# Patient Record
Sex: Male | Born: 1950 | Race: Black or African American | Hispanic: No | Marital: Single
Health system: Southern US, Community
[De-identification: ages and names within clinical notes are randomized; demographics above are authoritative.]

## PROBLEM LIST (undated history)

## (undated) DIAGNOSIS — R569 Unspecified convulsions: Secondary | ICD-10-CM

## (undated) HISTORY — DX: Unspecified convulsions: R56.9

---

## 2021-01-02 ENCOUNTER — Emergency Department (HOSPITAL_COMMUNITY)
Admission: EM | Admit: 2021-01-02 | Discharge: 2021-01-02 | Disposition: A | Discharge status: Home / Self Care | Payer: Medicare Other | Attending: Emergency Medicine | Admitting: Emergency Medicine

## 2021-01-02 ENCOUNTER — Emergency Department (HOSPITAL_COMMUNITY): Payer: Medicare Other

## 2021-01-02 ENCOUNTER — Other Ambulatory Visit: Payer: Self-pay

## 2021-01-02 DIAGNOSIS — R4182 Altered mental status, unspecified: Secondary | ICD-10-CM | POA: Diagnosis present

## 2021-01-02 DIAGNOSIS — R892 Abnormal level of other drugs, medicaments and biological substances in specimens from other organs, systems and tissues: Secondary | ICD-10-CM | POA: Diagnosis not present

## 2021-01-02 DIAGNOSIS — R269 Unspecified abnormalities of gait and mobility: Secondary | ICD-10-CM

## 2021-01-02 DIAGNOSIS — R2681 Unsteadiness on feet: Secondary | ICD-10-CM | POA: Diagnosis not present

## 2021-01-02 DIAGNOSIS — R404 Transient alteration of awareness: Secondary | ICD-10-CM | POA: Diagnosis not present

## 2021-01-02 DIAGNOSIS — R251 Tremor, unspecified: Secondary | ICD-10-CM | POA: Insufficient documentation

## 2021-01-02 DIAGNOSIS — R7889 Finding of other specified substances, not normally found in blood: Secondary | ICD-10-CM

## 2021-01-02 LAB — COMPREHENSIVE METABOLIC PANEL
ALT: 24 U/L (ref 0–44)
AST: 25 U/L (ref 15–41)
Albumin: 3.7 g/dL (ref 3.5–5.0)
Alkaline Phosphatase: 129 U/L — ABNORMAL HIGH (ref 38–126)
Anion gap: 7 (ref 5–15)
BUN: 14 mg/dL (ref 8–23)
CO2: 29 mmol/L (ref 22–32)
Calcium: 8.9 mg/dL (ref 8.9–10.3)
Chloride: 100 mmol/L (ref 98–111)
Creatinine, Ser: 1.11 mg/dL (ref 0.61–1.24)
GFR, Estimated: >60 mL/min (ref >60)
Glucose, Bld: 107 mg/dL — ABNORMAL HIGH (ref 70–99)
Potassium: 4.1 mmol/L (ref 3.5–5.1)
Sodium: 136 mmol/L (ref 135–145)
Total Bilirubin: 0.7 mg/dL (ref 0.3–1.2)
Total Protein: 7.1 g/dL (ref 6.5–8.1)

## 2021-01-02 LAB — LACTIC ACID, PLASMA: Lactic Acid, Venous: 1.3 mmol/L (ref 0.5–1.9)

## 2021-01-02 LAB — CBC WITH DIFFERENTIAL/PLATELET
Abs Immature Granulocytes: 0.04 10*3/uL (ref 0.00–0.07)
Basophils Absolute: 0 10*3/uL (ref 0.0–0.1)
Basophils Relative: 1 %
Eosinophils Absolute: 0.2 10*3/uL (ref 0.0–0.5)
Eosinophils Relative: 3 %
HCT: 44.6 % (ref 39.0–52.0)
Hemoglobin: 14.8 g/dL (ref 13.0–17.0)
Immature Granulocytes: 1 %
Lymphocytes Relative: 30 %
Lymphs Abs: 1.7 10*3/uL (ref 0.7–4.0)
MCH: 28 pg (ref 26.0–34.0)
MCHC: 33.2 g/dL (ref 30.0–36.0)
MCV: 84.5 fL (ref 80.0–100.0)
Monocytes Absolute: 0.7 10*3/uL (ref 0.1–1.0)
Monocytes Relative: 13 %
Neutro Abs: 2.9 10*3/uL (ref 1.7–7.7)
Neutrophils Relative %: 52 %
Platelets: 232 10*3/uL (ref 150–400)
RBC: 5.28 MIL/uL (ref 4.22–5.81)
RDW: 13.3 % (ref 11.5–15.5)
WBC: 5.6 10*3/uL (ref 4.0–10.5)
nRBC: 0 % (ref 0.0–0.2)

## 2021-01-02 LAB — URINALYSIS, MICROSCOPIC (REFLEX): Squamous Epithelial / HPF: NONE SEEN (ref 0–5)

## 2021-01-02 LAB — PHENOBARBITAL LEVEL: Phenobarbital: 41 ug/mL — ABNORMAL HIGH (ref 15.0–30.0)

## 2021-01-02 LAB — AMMONIA: Ammonia: 20 umol/L (ref 9–35)

## 2021-01-02 LAB — URINALYSIS, ROUTINE W REFLEX MICROSCOPIC
Bilirubin Urine: NEGATIVE
Glucose, UA: NEGATIVE mg/dL
Ketones, ur: NEGATIVE mg/dL
Leukocytes,Ua: NEGATIVE
Nitrite: NEGATIVE
Protein, ur: NEGATIVE mg/dL
Specific Gravity, Urine: 1.015 (ref 1.005–1.030)
pH: 7 (ref 5.0–8.0)

## 2021-01-02 LAB — PHENYTOIN LEVEL, TOTAL: Phenytoin Lvl: 36.3 ug/mL (ref 10.0–20.0)

## 2021-01-02 LAB — TROPONIN I (HIGH SENSITIVITY)
Troponin I (High Sensitivity): 14 ng/L (ref <18)
Troponin I (High Sensitivity): 13 ng/L (ref <18)

## 2021-01-02 MED ORDER — PHENYTOIN SODIUM EXTENDED 100 MG PO CAPS: 300.0000 mg | ORAL_CAPSULE | Freq: Every day | ORAL | 0 refills | Status: AC

## 2021-01-02 MED ORDER — SODIUM CHLORIDE 0.9 % IV BOLUS
1000.0000 mL | Freq: Once | INTRAVENOUS | Status: AC
  Administered 2021-01-02: 06:00:00 1000 mL via INTRAVENOUS

## 2021-01-02 NOTE — Discharge Instructions (Signed)
You were seen today for altered mental status and gait disturbance.  This may be related to elevated phenytoin levels.  Do not take phenytoin on Tuesday or Wednesday.  Restart phenytoin on Thursday.  Take 300 mg nightly starting on Thursday.  Also do not take phenobarbital today.  You may restart your normal dosage tomorrow.  Follow-up with your primary physician for repeat levels 1 week ago.

## 2021-01-02 NOTE — ED Triage Notes (Signed)
Pt brought to ED by sister with c/o AMS, confusion and abnormal gait x1 week. Has hx of epilepsy and MR. Sister also endorses pt reports of painful urination.

## 2021-01-02 NOTE — ED Provider Notes (Signed)
Emergency Medicine Provider Triage Evaluation Note  Lee Mckenzie , a 70 y.o. male  was evaluated in triage.  Pt complains of AMS.  States has been off balance lately, sliding out of bed, nearly falling in bathroom, etc.  Denies known head injury or LOC.  States he did complain of some dysuria today.  Has hx of seizures and developmental delays.  Takes dilantin, phenobarbital, and lorazepam for seizures-- levels not checked recently.  Review of Systems  Positive: AMS, dysuria Negative: fever  Physical Exam  BP 133/84 (BP Location: Right Arm)    Pulse 75    Temp 97.8 F (36.6 C) (Oral)    Resp 18    SpO2 100%   Gen:   Awake, no distress, no visible head trauma Resp:  Normal effort  MSK:   Moves extremities without difficulty  Other:  Able to answer some questions, following commands when prompted, gait not tested in triage  Medical Decision Making  Medically screening exam initiated at 2:14 AM.  Appropriate orders placed.  Lee Mckenzie was informed that the remainder of the evaluation will be completed by another provider, this initial triage assessment does not replace that evaluation, and the importance of remaining in the ED until their evaluation is complete.  AMS.  On multiple seizure medications, has been compliant per family at bedside.  No falls or head trauma but has been sliding out of bed, etc.  VSS.  Will check labs, med levels, UA w/culture, CT head, CXR.   Garlon Hatchet, PA-C 01/02/21 [id number]    Shon Baton, MD 01/02/21 [phone]

## 2021-01-02 NOTE — ED Provider Notes (Signed)
[facility] EMERGENCY DEPARTMENT Provider Note   CSN: [id number] Arrival date & time: 01/02/21  0030     History Chief Complaint  Patient presents with   Altered Mental Status    Lee Mckenzie is a 70 y.o. male.  HPI     This is a 70 year old male with a history of epilepsy who presents with altered mental status.  Sister provides most of the history.  He lives primarily with his sister.  She reports that he at baseline has a developmental delay.  Over the last week to 2 weeks, he has had increasing altered mental status.  He states that he is not directable and will try multiple times to get out of bed after being told not to.  She states that he has fallen to the floor several times but has not hit his head or lost consciousness.  She reports that he appears unsteady on his feet.  He has a history of epilepsy but has not had a seizure in many years.  He has not had any recent changes in medications.  Denies any recent illnesses including fever, cough, congestion, shortness of breath.  Patient denies any chest pain.  Does report some dysuria and urinary frequency.  No past medical history on file.  There are no problems to display for this patient.  PMH: EPilepsy  No family history on file.     Home Medications Prior to Admission medications   Medication Sig Start Date End Date Taking? Authorizing Provider  phenytoin (DILANTIN) 100 MG ER capsule Take 3 capsules (300 mg total) by mouth at bedtime. 01/02/21  Yes Maximino Cozzolino, Mayer Masker, MD    Allergies    Patient has no known allergies.  Review of Systems   Review of Systems  Constitutional:  Negative for fever.  Respiratory:  Negative for shortness of breath.   Cardiovascular:  Negative for chest pain.  Gastrointestinal:  Negative for abdominal pain, nausea and vomiting.  Genitourinary:  Positive for dysuria and frequency.  Musculoskeletal:  Positive for gait problem.  Neurological:  Positive  for weakness. Negative for seizures.  All other systems reviewed and are negative.  Physical Exam Updated Vital Signs BP (!) 177/82    Pulse 98    Temp 97.8 F (36.6 C) (Oral)    Resp 15    Ht 1.956 m (6\' 5" )    Wt 88 kg    SpO2 100%    BMI 23.01 kg/m   Physical Exam Vitals and nursing note reviewed.  Constitutional:      Appearance: He is well-developed.     Comments: Chronically ill-appearing but nontoxic  HENT:     Head: Normocephalic and atraumatic.     Nose: Nose normal.     Mouth/Throat:     Mouth: Mucous membranes are moist.  Eyes:     Pupils: Pupils are equal, round, and reactive to light.  Cardiovascular:     Rate and Rhythm: Normal rate and regular rhythm.     Heart sounds: Normal heart sounds. No murmur heard. Pulmonary:     Effort: Pulmonary effort is normal. No respiratory distress.     Breath sounds: Normal breath sounds. No wheezing.  Abdominal:     General: Bowel sounds are normal.     Palpations: Abdomen is soft.     Tenderness: There is no abdominal tenderness. There is no rebound.  Musculoskeletal:     Cervical back: Neck supple.  Lymphadenopathy:     Cervical: No  cervical adenopathy.  Skin:    General: Skin is warm and dry.  Neurological:     Mental Status: He is alert and oriented to person, place, and time.     Comments: Oriented to self and place but not time (baseline), 5 out of 5 strength in all 4 extremities, slight resting tremor noted, no dysmetria to finger-nose-finger, follows commands, unsteady but not ataxic gait noted  Psychiatric:        Mood and Affect: Mood normal.    ED Results / Procedures / Treatments   Labs (all labs ordered are listed, but only abnormal results are displayed) Labs Reviewed  PHENYTOIN LEVEL, TOTAL - Abnormal; Notable for the following components:      Result Value   Phenytoin Lvl 36.3 (*)    All other components within normal limits  COMPREHENSIVE METABOLIC PANEL - Abnormal; Notable for the following  components:   Glucose, Bld 107 (*)    Alkaline Phosphatase 129 (*)    All other components within normal limits  URINALYSIS, ROUTINE W REFLEX MICROSCOPIC - Abnormal; Notable for the following components:   Hgb urine dipstick TRACE (*)    All other components within normal limits  PHENOBARBITAL LEVEL - Abnormal; Notable for the following components:   Phenobarbital 41.0 (*)    All other components within normal limits  URINALYSIS, MICROSCOPIC (REFLEX) - Abnormal; Notable for the following components:   Bacteria, UA RARE (*)    All other components within normal limits  URINE CULTURE  CBC WITH DIFFERENTIAL/PLATELET  AMMONIA  LACTIC ACID, PLASMA  TROPONIN I (HIGH SENSITIVITY)  TROPONIN I (HIGH SENSITIVITY)    EKG EKG Interpretation  Date/Time:  Tuesday January 02 2021 02:18:08 EST Ventricular Rate:  75 PR Interval:  176 QRS Duration: 92 QT Interval:  368 QTC Calculation: 410 R Axis:   90 Text Interpretation: Normal sinus rhythm Rightward axis Left ventricular hypertrophy with repolarization abnormality ( Sokolow-Lyon ) ST depressions inferior and laterally Slight elevation 1/avL No CP, no prior for comparison Confirmed by Ross Marcus ([id number]) on 01/02/2021 3:07:38 AM  Radiology DG Chest 2 View  Result Date: 01/02/2021 CLINICAL DATA:  Altered mental status. EXAM: CHEST - 2 VIEW COMPARISON:  None. FINDINGS: The heart size and mediastinal contours are within normal limits. Lung volumes are low. Mild atelectasis is present at the left lung base. The right lung is clear. No effusion or pneumothorax. No acute osseous abnormality. IMPRESSION: Low lung volumes with atelectasis at the left lung base. Electronically Signed   By: Thornell Sartorius M.D.   On: 01/02/2021 04:48   CT HEAD WO CONTRAST ( )  Result Date: 01/02/2021 CLINICAL DATA:  Altered mental status. EXAM: CT HEAD WITHOUT CONTRAST TECHNIQUE: Contiguous axial images were obtained from the base of the skull through the  vertex without intravenous contrast. COMPARISON:  None. FINDINGS: Brain: There is mild cerebral atrophy with widening of the extra-axial spaces and ventricular dilatation. There are areas of decreased attenuation within the white matter tracts of the supratentorial brain, consistent with microvascular disease changes. Chronic bilateral basal ganglia lacunar infarcts are seen. Vascular: No hyperdense vessel or unexpected calcification. Skull: Normal. Negative for fracture or focal lesion. Sinuses/Orbits: There is mild left maxillary sinus mucosal thickening. Other: None. IMPRESSION: 1. Generalized cerebral atrophy. 2. Chronic bilateral basal ganglia lacunar infarcts. 3. No acute intracranial abnormality. Electronically Signed   By: Aram Candela M.D.   On: 01/02/2021 03:01    Procedures Procedures   Medications Ordered in ED Medications  sodium  chloride 0.9 % bolus 1,000 mL (1,000 mLs Intravenous New Bag/Given 01/02/21 4128)    ED Course  I have reviewed the triage vital signs and the nursing notes.  Pertinent labs & imaging results that were available during my care of the patient were reviewed by me and considered in my medical decision making (see chart for details).  Clinical Course as of 01/02/21 0723  Tue Jan 02, 2021  0555 Consulted neurology regarding supratherapeutic phenytoin and phenobarb levels.  Supratherapeutic phenytoin level could be related to gait changes and altered mental status.  Recommends holding phenytoin for 2 doses and restarting at 150 mig BID. [CH]    Clinical Course User Index [CH] Nallely Yost, Mayer Masker, MD   MDM Rules/Calculators/A&P                           Patient presents today with altered mental status.  Seems indolent in nature and has been progressive in the last several weeks.  Lives primarily with his sister.  He has nontoxic-appearing and his vital signs are reassuring.  He is afebrile.  Does have some urinary symptoms.  UTI could certainly explain  some of the symptoms.  He has no acute focal deficits but does have a developmental delay and at baseline has some disorientation.  No evidence of UTI.  No leukocytosis or metabolic derangements.  He does have elevated phenytoin and phenobarbital levels.  Has not recently had any significant changes in meds or levels taken.  I suspect some of his symptoms may be related to supratherapeutic phenytoin levels.  I engaged neurology who also feels this may be culprit.  Pharmacy has reviewed his medication.  Recommend medication adjustments including changing phenytoin to 300 mg nightly starting Thursday.  He should hold his dosage until then.  They also recommend holding phenobarbital for today and restarting tomorrow.  He needs repeat levels in 1 week.  This was discussed with his sister he was agreeable to plan.  After history, exam, and medical workup I feel the patient has been appropriately medically screened and is safe for discharge home. Pertinent diagnoses were discussed with the patient. Patient was given return precautions.      Final Clinical Impression(s) / ED Diagnoses Final diagnoses:  Elevated phenytoin level  Transient alteration of awareness  Gait disturbance    Rx / DC Orders ED Discharge Orders          Ordered    phenytoin (DILANTIN) 100 MG ER capsule  Daily at bedtime        01/02/21 0720             Ayshia Gramlich, Mayer Masker, MD 01/02/21 [phone]

## 2021-01-02 NOTE — ED Notes (Signed)
Reviewed discharge instructions with patient and sister. Follow-up care and medications changes reviewed. Patient's sister verbalized understanding. Patient A&Ox4, VSS upon discharge.

## 2021-01-03 LAB — URINE CULTURE: Culture: NO GROWTH

## 2021-01-29 ENCOUNTER — Other Ambulatory Visit: Payer: Self-pay | Admitting: Internal Medicine

## 2021-02-01 LAB — LIPID PANEL
Cholesterol: 209 mg/dL — ABNORMAL HIGH (ref <200)
HDL: 53 mg/dL (ref >=40)
LDL Cholesterol (Calc): 133 mg/dL (calc) — ABNORMAL HIGH
Non-HDL Cholesterol (Calc): 156 mg/dL (calc) — ABNORMAL HIGH (ref <130)
Total CHOL/HDL Ratio: 3.9 (calc) (ref <5.0)
Triglycerides: 119 mg/dL (ref <150)

## 2021-02-01 LAB — PHENYTOIN LEVEL, TOTAL: Phenytoin, Total: 14 mg/L (ref 10.0–20.0)

## 2021-02-01 LAB — CBC
HCT: 44.4 % (ref 38.5–50.0)
Hemoglobin: 15.2 g/dL (ref 13.2–17.1)
MCH: 28.1 pg (ref 27.0–33.0)
MCHC: 34.2 g/dL (ref 32.0–36.0)
MCV: 82.2 fL (ref 80.0–100.0)
MPV: 11.4 fL (ref 7.5–12.5)
Platelets: 218 10*3/uL (ref 140–400)
RBC: 5.4 10*6/uL (ref 4.20–5.80)
RDW: 13.3 % (ref 11.0–15.0)
WBC: 5.3 10*3/uL (ref 3.8–10.8)

## 2021-02-01 LAB — PSA: PSA: 0.81 ng/mL (ref <=4.00)

## 2021-02-01 LAB — COMPLETE METABOLIC PANEL WITH GFR
AG Ratio: 1.5 (calc) (ref 1.0–2.5)
ALT: 26 U/L (ref 9–46)
AST: 22 U/L (ref 10–35)
Albumin: 4.2 g/dL (ref 3.6–5.1)
Alkaline phosphatase (APISO): 138 U/L (ref 35–144)
BUN: 18 mg/dL (ref 7–25)
CO2: 27 mmol/L (ref 20–32)
Calcium: 9.6 mg/dL (ref 8.6–10.3)
Chloride: 104 mmol/L (ref 98–110)
Creat: 1.07 mg/dL (ref 0.70–1.35)
Globulin: 2.8 g/dL (calc) (ref 1.9–3.7)
Glucose, Bld: 73 mg/dL (ref 65–99)
Potassium: 4.5 mmol/L (ref 3.5–5.3)
Sodium: 138 mmol/L (ref 135–146)
Total Bilirubin: 0.4 mg/dL (ref 0.2–1.2)
Total Protein: 7 g/dL (ref 6.1–8.1)
eGFR: 75 mL/min/{1.73_m2} (ref >=60)

## 2021-02-01 LAB — VITAMIN D 25 HYDROXY (VIT D DEFICIENCY, FRACTURES): Vit D, 25-Hydroxy: 34 ng/mL (ref 30–100)

## 2021-02-01 LAB — URINE CULTURE
MICRO NUMBER:: 12875486
Result:: NO GROWTH
SPECIMEN QUALITY:: ADEQUATE

## 2021-02-01 LAB — PHENOBARBITAL LEVEL: Phenobarbital, Serum: 35.6 mg/L (ref 15.0–40.0)

## 2021-02-01 LAB — TSH: TSH: 1.5 mIU/L (ref 0.40–4.50)

## 2021-02-09 ENCOUNTER — Other Ambulatory Visit: Payer: Self-pay

## 2021-02-09 ENCOUNTER — Ambulatory Visit: Payer: Medicare Other | Attending: Internal Medicine | Admitting: Physical Therapy

## 2021-02-09 ENCOUNTER — Encounter: Payer: Self-pay | Admitting: Physical Therapy

## 2021-02-09 DIAGNOSIS — R2689 Other abnormalities of gait and mobility: Secondary | ICD-10-CM | POA: Diagnosis present

## 2021-02-09 DIAGNOSIS — M6281 Muscle weakness (generalized): Secondary | ICD-10-CM | POA: Insufficient documentation

## 2021-02-09 DIAGNOSIS — R293 Abnormal posture: Secondary | ICD-10-CM | POA: Diagnosis present

## 2021-02-09 DIAGNOSIS — R262 Difficulty in walking, not elsewhere classified: Secondary | ICD-10-CM | POA: Diagnosis present

## 2021-02-09 NOTE — Therapy (Signed)
[facility] [address] [place], [place], [address] Phone: [phone]   Fax:  [phone]  Physical Therapy Evaluation  Patient Details  Name: Tadarius Maland MRN: [id number] Date of Birth: 01/07/1951 Referring Provider (PT): Fleet Contras, MD   Encounter Date: 02/09/2021   PT End of Session - 02/09/21 0907     Visit Number 1    Number of Visits 16    Date for PT Re-Evaluation 04/06/21    Authorization Type Medicare -- PN Visit 10    Progress Note Due on Visit 10    PT Start Time 0858   Late arrival   PT Stop Time 0932    PT Time Calculation (min) 34 min    Equipment Utilized During Treatment Gait belt    Activity Tolerance Patient tolerated treatment well    Behavior During Therapy [facility] for tasks assessed/performed             Past Medical History:  Diagnosis Date   Seizures (HCC)     History reviewed. No pertinent surgical history.  There were no vitals filed for this visit.    Subjective Assessment - 02/09/21 0858     Subjective Family provides history. Sister notes that pt woke up and looked like he was leaning. It started to get worse where it looked like he was always going to fall with transfers and amb. They note increased difficulty with sit to stand transfers. Readjusted medication and it helped. Sister states that pt always has had shaking (has history of seizures). She also notes difficulty with floor transfers. No seizures in 14-15 years. Sister states there has been overall reduced activity level since COVID (was doing chair exercises).    Patient is accompained by: Family member   Sister and niece   Pertinent History Epilepsy    Limitations Walking;Standing;House hold activities    How long can you sit comfortably? n/a    How long can you stand comfortably? n/a    How long can you walk comfortably? Can go to [employer] and through parking lot but now needs to hold on to cart  (did not need the cart previously)    Patient Stated Goals Improve balance and sitting to standing    Currently in Pain? No/denies                [facility] PT Assessment - 02/09/21 0001       Assessment   Medical Diagnosis R26.89 (ICD-10-CM) - Balance disorder    Referring Provider (PT) Fleet Contras, MD    Onset Date/Surgical Date --   End of Dec 2022   Prior Therapy None      Precautions   Precautions Fall      Restrictions   Weight Bearing Restrictions No      Balance Screen   Has the patient fallen in the past 6 months No      Home Environment   Living Environment Private residence    Living Arrangements Other relatives    Available Help at Discharge Family    Type of Home House    Home Access Stairs to enter    Entrance Stairs-Number of Steps 0    Home Layout Two level;Able to live on main level with bedroom/bathroom    Home Equipment None      Prior Function   Level of Independence Independent with basic ADLs   Able to sit and do his own washing with set up; can go  grocery shopping with family supervision   Vocation Unemployed    Leisure Dancing      Cognition   Overall Cognitive Status History of cognitive impairments - at baseline   Follows simple commands, requires repetition at times     Observation/Other Assessments   Focus on Therapeutic Outcomes (FOTO)  n/a      Sensation   Light Touch Appears Intact      Coordination   Gross Motor Movements are Fluid and Coordinated No   Bilat hand tremors at baseline per family report     Functional Tests   Functional tests Sit to Stand      Sit to Stand   Comments Needs use of UEs, increased forward trunk flexion, knees remain flexed throughout      Posture/Postural Control   Posture/Postural Control Postural limitations    Postural Limitations Forward head;Rounded Shoulders;Flexed trunk      ROM / Strength   AROM / PROM / Strength Strength      Strength   Overall Strength Comments Difficulty  assessing well due to pt difficulty following commands; however, bilat LEs grossly 3+/5 to 4/5      Transfers   Five time sit to stand comments  28.34      Ambulation/Gait   Ambulation Distance (Feet) 100 Feet    Assistive device None    Gait Pattern Step-through pattern;Trunk flexed;Abducted - left;Abducted- right;Wide base of support;Trendelenburg;Shuffle    Ambulation Surface Level;Indoor      Standardized Balance Assessment   Standardized Balance Assessment Berg Balance Test;Timed Up and Go Test      Berg Balance Test   Sit to Stand Able to stand  independently using hands    Standing Unsupported Able to stand 2 minutes with supervision    Sitting with Back Unsupported but Feet Supported on Floor or Stool Able to sit safely and securely 2 minutes    Stand to Sit Controls descent by using hands    Transfers Able to transfer safely, definite need of hands    Standing Unsupported with Eyes Closed Able to stand 10 seconds with supervision    Standing Unsupported with Feet Together Able to place feet together independently and stand for 1 minute with supervision    From Standing, Reach Forward with Outstretched Arm Can reach forward >5 cm safely (2")    From Standing Position, Pick up Object from Floor Able to pick up shoe, needs supervision    From Standing Position, Turn to Look Behind Over each Shoulder Turn sideways only but maintains balance    Turn 360 Degrees Needs close supervision or verbal cueing    Standing Unsupported, Alternately Place Feet on Step/Stool Able to complete >2 steps/needs minimal assist    Standing Unsupported, One Foot in Front Able to take small step independently and hold 30 seconds    Standing on One Leg Unable to try or needs assist to prevent fall    Total Score 33    Berg comment: 33/56      Timed Up and Go Test   Normal TUG (seconds) 17.63                        Objective measurements completed on examination: See above findings.                 PT Education - 02/09/21 1121     Education Details Discussed exam findings, POC and starting HEP    Person(s) Educated  Patient    Methods Explanation;Demonstration;Tactile cues;Verbal cues    Comprehension Verbalized understanding;Returned demonstration;Verbal cues required;Tactile cues required              PT Short Term Goals - 02/09/21 1122       PT SHORT TERM GOAL #1   Title Pt will be able to perform HEP with family supervision    Time 4    Period Weeks    Status New    Target Date 03/09/21      PT SHORT TERM GOAL #2   Title Pt will improve 5x STS to </=20 sec    Baseline 28.34    Time 4    Period Weeks    Status New    Target Date 03/09/21      PT SHORT TERM GOAL #3   Title Pt will improve Berg Balance Score to at least 40/56 to demo decreasing fall risk    Baseline 33/56    Time 4    Period Weeks    Status New    Target Date 03/09/21      PT SHORT TERM GOAL #4   Title PT will assess and create appropriate DGI goal    Time 2    Period Weeks    Status New    Target Date 02/23/21      PT SHORT TERM GOAL #5   Title Pt will have improved TUG score to </=13 sec to demo reduced fall risk    Time 4    Period Weeks    Status New    Target Date 03/09/21               PT Long Term Goals - 02/09/21 1124       PT LONG TERM GOAL #1   Title Pt will be able to return to a consistent exercise regimen at home    Time 8    Period Weeks    Status New    Target Date 04/06/21      PT LONG TERM GOAL #2   Title Pt will improve Berg Balance Score to at least 45/56 for decreased fall risk    Time 8    Period Weeks    Status New    Target Date 04/06/21      PT LONG TERM GOAL #3   Title Pt will improve 5x STS to </=15 sec to demo improved functional LE strength and balance    Time 8    Period Weeks    Status New    Target Date 04/06/21      PT LONG TERM GOAL #4   Title Pt will be able to perform floor to stand transfers with  SBA    Time 8    Period Weeks    Status New    Target Date 04/06/21                    Plan - 02/09/21 1116     Clinical Impression Statement Mr. Clovis RileyMitchell is a 71 y/o M presenting to OPPT due to family worries about his increasingly unsteady gait and transfers. PMH significant for epilepsy. On assessment, pt demos gross bilat LE and trunk weakness, gait instability with high fall risk based on his TUG and Berg Balance Score. Pt follows simple commands best and requires some repetition. Pt has tremors at baseline. Pt would highly benefit from PT to improve his mobility issues for safer home and community amb.  Personal Factors and Comorbidities Age;Time since onset of injury/illness/exacerbation;Past/Current Experience    Examination-Activity Limitations Locomotion Level;Bend;Hygiene/Grooming;Stand;Squat;Transfers;Toileting    Examination-Participation Restrictions Community Activity;Shop    Stability/Clinical Decision Making Evolving/Moderate complexity    Clinical Decision Making Moderate    Rehab Potential Good    PT Frequency 2x / week    PT Duration 8 weeks    PT Treatment/Interventions ADLs/Self Care Home Management;Aquatic Therapy;Moist Heat;Cryotherapy;DME Instruction;Gait training;Stair training;Functional mobility training;Therapeutic activities;Therapeutic exercise;Balance training;Neuromuscular re-education;Manual techniques;Patient/family education;Passive range of motion;Dry needling;Taping    PT Next Visit Plan Initiate stretching and strengthening of bilat LEs. Work on balance. Assess DGI if able.    Consulted and Agree with Plan of Care Patient;Family member/caregiver    Family Member Consulted Sister and niece             Patient will benefit from skilled therapeutic intervention in order to improve the following deficits and impairments:  Abnormal gait, Decreased range of motion, Decreased coordination, Difficulty walking, Decreased endurance, Decreased  activity tolerance, Decreased balance, Hypomobility, Decreased mobility, Decreased strength, Postural dysfunction  Visit Diagnosis: Difficulty in walking, not elsewhere classified  Muscle weakness (generalized)  Other abnormalities of gait and mobility  Abnormal posture     Problem List There are no problems to display for this patient.   [facility] [address]April Ma L Anvi Mangal, PT, DPT 02/09/2021, 11:28 AM  [facility] [address] [place], [place], [address] Phone: [phone]   Fax:  [phone]  Name: Chapman MossFrankie Leonard Popowski MRN: [id number] Date of Birth: Jan 15, 1951

## 2021-02-20 ENCOUNTER — Other Ambulatory Visit: Payer: Self-pay

## 2021-02-20 ENCOUNTER — Ambulatory Visit: Payer: Medicare Other | Attending: Internal Medicine | Admitting: Physical Therapy

## 2021-02-20 DIAGNOSIS — R2689 Other abnormalities of gait and mobility: Secondary | ICD-10-CM | POA: Insufficient documentation

## 2021-02-20 DIAGNOSIS — R262 Difficulty in walking, not elsewhere classified: Secondary | ICD-10-CM | POA: Insufficient documentation

## 2021-02-20 DIAGNOSIS — R293 Abnormal posture: Secondary | ICD-10-CM | POA: Insufficient documentation

## 2021-02-20 DIAGNOSIS — M6281 Muscle weakness (generalized): Secondary | ICD-10-CM | POA: Insufficient documentation

## 2021-02-20 NOTE — Therapy (Signed)
[facility] [address] [place], [place], [address] Phone: [phone]   Fax:  [phone]  Physical Therapy Treatment  Patient Details  Name: Lee Mckenzie MRN: [id number] Date of Birth: 01-16-1950 Referring Provider (PT): Nolene Ebbs, MD   Encounter Date: 02/20/2021   PT End of Session - 02/20/21 1447     Visit Number 2    Number of Visits 16    Date for PT Re-Evaluation 04/06/21    Authorization Type Medicare -- PN Visit 10    Progress Note Due on Visit 10    PT Start Time 1405    PT Stop Time 1445    PT Time Calculation (min) 40 min    Equipment Utilized During Treatment Gait belt    Activity Tolerance Patient tolerated treatment well    Behavior During Therapy WFL for tasks assessed/performed             Past Medical History:  Diagnosis Date   Seizures ([place])     No past surgical history on file.  There were no vitals filed for this visit.   Subjective Assessment - 02/20/21 1447     Subjective Pt reports nothing new or different. No falls noted.    Patient is accompained by: Family member   Sister and niece   Pertinent History Epilepsy    Limitations Walking;Standing;House hold activities    How long can you sit comfortably? n/a    How long can you stand comfortably? n/a    How long can you walk comfortably? Can go to [employer] and through parking lot but now needs to hold on to cart (did not need the cart previously)    Patient Stated Goals Improve balance and sitting to standing                               [facility] Adult PT Treatment/Exercise - 02/20/21 0001       Exercises   Exercises Lumbar      Lumbar Exercises: Stretches   Passive Hamstring Stretch 2 reps;30 seconds;Right;Left    Passive Hamstring Stretch Limitations seated with foot on 4" step    Other Lumbar Stretch Exercise Seated lumbar extension 2x30 sec    Other Lumbar Stretch Exercise  Sidelying open/close book x10      Lumbar Exercises: Seated   Sit to Stand 20 reps    Sit to Stand Limitations cues for glute squeeze and trunk extension      Lumbar Exercises: Supine   Bridge 20 reps    Bridge Limitations beginner bridge      Lumbar Exercises: Sidelying   Clam Right;Left;20 reps                 Balance Exercises - 02/20/21 0001       Balance Exercises: Standing   Standing Eyes Opened Narrow base of support (BOS)   head turns 2x10   SLS with Vectors Solid surface    SLS with Vectors Limitations x10 lateral tap on to target                PT Education - 02/20/21 1448     Education Details Discussed initial HEP    Person(s) Educated Patient;Other (comment)   Niece   Methods Explanation;Demonstration;Tactile cues;Verbal cues;Handout    Comprehension Verbalized understanding;Returned demonstration;Verbal cues required;Tactile cues required  PT Short Term Goals - 02/09/21 1122       PT SHORT TERM GOAL #1   Title Pt will be able to perform HEP with family supervision    Time 4    Period Weeks    Status New    Target Date 03/09/21      PT SHORT TERM GOAL #2   Title Pt will improve 5x STS to </=20 sec    Baseline 28.34    Time 4    Period Weeks    Status New    Target Date 03/09/21      PT SHORT TERM GOAL #3   Title Pt will improve Berg Balance Score to at least 40/56 to demo decreasing fall risk    Baseline 33/56    Time 4    Period Weeks    Status New    Target Date 03/09/21      PT SHORT TERM GOAL #4   Title PT will assess and create appropriate DGI goal    Time 2    Period Weeks    Status New    Target Date 02/23/21      PT SHORT TERM GOAL #5   Title Pt will have improved TUG score to </=13 sec to demo reduced fall risk    Time 4    Period Weeks    Status New    Target Date 03/09/21               PT Long Term Goals - 02/09/21 1124       PT LONG TERM GOAL #1   Title Pt will be able to return  to a consistent exercise regimen at home    Time 8    Period Weeks    Status New    Target Date 04/06/21      PT LONG TERM GOAL #2   Title Pt will improve Berg Balance Score to at least 45/56 for decreased fall risk    Time 8    Period Weeks    Status New    Target Date 04/06/21      PT LONG TERM GOAL #3   Title Pt will improve 5x STS to </=15 sec to demo improved functional LE strength and balance    Time 8    Period Weeks    Status New    Target Date 04/06/21      PT LONG TERM GOAL #4   Title Pt will be able to perform floor to stand transfers with SBA    Time 8    Period Weeks    Status New    Target Date 04/06/21                   Plan - 02/20/21 1448     Clinical Impression Statement Treatment focused on initiating a strengthening, stretching, and balance program. Pt with very stiff spine and shortened hamstrings. Worked on hip strengthening. Attempted to try prone; however, pt became too anxious and required reassurance. Only able to tolerate sidelying and supine in bed.    Personal Factors and Comorbidities Age;Time since onset of injury/illness/exacerbation;Past/Current Experience    Examination-Activity Limitations Locomotion Level;Bend;Hygiene/Grooming;Stand;Squat;Transfers;Toileting    Examination-Participation Restrictions Community Activity;Shop    Stability/Clinical Decision Making Evolving/Moderate complexity    Rehab Potential Good    PT Frequency 2x / week    PT Duration 8 weeks    PT Treatment/Interventions ADLs/Self Care Home Management;Aquatic Therapy;Moist Heat;Cryotherapy;DME Instruction;Gait training;Stair training;Functional mobility  training;Therapeutic activities;Therapeutic exercise;Balance training;Neuromuscular re-education;Manual techniques;Patient/family education;Passive range of motion;Dry needling;Taping    PT Next Visit Plan Continue stretching and strengthening of bilat LEs. Work on balance. Assess DGI if able.    PT Home Exercise  Plan Access Code: [email or web address]    Consulted and Agree with Plan of Care Patient;Family member/caregiver    Family Member Consulted Niece             Patient will benefit from skilled therapeutic intervention in order to improve the following deficits and impairments:  Abnormal gait, Decreased range of motion, Decreased coordination, Difficulty walking, Decreased endurance, Decreased activity tolerance, Decreased balance, Hypomobility, Decreased mobility, Decreased strength, Postural dysfunction  Visit Diagnosis: Difficulty in walking, not elsewhere classified  Muscle weakness (generalized)  Other abnormalities of gait and mobility  Abnormal posture     Problem List There are no problems to display for this patient.   [facility] [address], PT, DPT 02/20/2021, 3:47 PM  [facility] [address] [place], [place], [address] Phone: [phone]   Fax:  [phone]  Name: Ibaad Khoury MRN: [id number] Date of Birth: [birth year]

## 2021-02-23 ENCOUNTER — Other Ambulatory Visit: Payer: Self-pay

## 2021-02-23 ENCOUNTER — Ambulatory Visit: Payer: Medicare Other | Admitting: Physical Therapy

## 2021-02-23 DIAGNOSIS — R262 Difficulty in walking, not elsewhere classified: Secondary | ICD-10-CM

## 2021-02-23 DIAGNOSIS — R293 Abnormal posture: Secondary | ICD-10-CM

## 2021-02-23 DIAGNOSIS — R2689 Other abnormalities of gait and mobility: Secondary | ICD-10-CM

## 2021-02-23 DIAGNOSIS — M6281 Muscle weakness (generalized): Secondary | ICD-10-CM

## 2021-02-23 NOTE — Therapy (Signed)
[facility] [address] [place], [place], [address] Phone: [phone]   Fax:  [phone]  Physical Therapy Treatment  Patient Details  Name: Lee Mckenzie MRN: [id number] Date of Birth: [birth year] Referring Provider (PT): Fleet Contras, MD   Encounter Date: 02/23/2021   PT End of Session - 02/23/21 1235     Visit Number 3    Number of Visits 16    Date for PT Re-Evaluation 04/06/21    Authorization Type Medicare -- PN Visit 10    Progress Note Due on Visit 10    PT Start Time 1148    PT Stop Time 1230    PT Time Calculation (min) 42 min    Equipment Utilized During Treatment Gait belt    Activity Tolerance Patient tolerated treatment well    Behavior During Therapy WFL for tasks assessed/performed             Past Medical History:  Diagnosis Date   Seizures ([facility])     No past surgical history on file.  There were no vitals filed for this visit.   Subjective Assessment - 02/23/21 1150     Subjective "Just trying to make it through."  Achy all over.  Exercises are going okay, trying to get loosened up.    Patient is accompained by: Family member   Sister and niece   Pertinent History Epilepsy    Limitations Walking;Standing;House hold activities    How long can you sit comfortably? n/a    How long can you stand comfortably? n/a    How long can you walk comfortably? Can go to [employer] and through parking lot but now needs to hold on to cart (did not need the cart previously)    Patient Stated Goals Improve balance and sitting to standing    Currently in Pain? Yes                [facility] PT Assessment - 02/23/21 1221       Standardized Balance Assessment   Standardized Balance Assessment Dynamic Gait Index      Dynamic Gait Index   Level Surface Mild Impairment    Change in Gait Speed Moderate Impairment    Gait with Horizontal Head Turns Mild Impairment    Gait with  Vertical Head Turns Mild Impairment    Gait and Pivot Turn Mild Impairment    Step Over Obstacle Mild Impairment    Step Around Obstacles Mild Impairment    Steps Moderate Impairment    Total Score 14    DGI comment: 14/24                [facility] Adult PT Treatment/Exercise - 02/23/21 1151       Exercises   Exercises Knee/Hip      Lumbar Exercises: Supine   Other Supine Lumbar Exercises Adductor squeeze of pillow while performing lower trunk and hip rotation to L and R x 5 reps each      Knee/Hip Exercises: Standing   Functional Squat 2 sets;5 reps;Limitations    Functional Squat Limitations holding 3lb weight in each hand, cues for reaching down to floor back up to stand with min A.      Knee/Hip Exercises: Supine   Quad Sets Strengthening;Right;Left;Both;1 set;10 reps;Limitations    Quad Sets Limitations single leg with heel elevated, 6 seconds hold.  Performed with bilat heels on pillow, bilat activation x 6 second hold    Bridges Strengthening;Both;2 sets;10  reps;Limitations    Bridges Limitations Straight leg hamstring bridges for more isolated hip extension with bilat feet on peanut ball, 5 reps x 6 second hold.    Bridges with Beacher May Strengthening;Both;1 set;5 reps                PT Short Term Goals - 02/23/21 1236       PT SHORT TERM GOAL #1   Title Pt will be able to perform HEP with family supervision    Time 4    Period Weeks    Status New    Target Date 03/09/21      PT SHORT TERM GOAL #2   Title Pt will improve 5x STS to </=20 sec    Baseline 28.34    Time 4    Period Weeks    Status New    Target Date 03/09/21      PT SHORT TERM GOAL #3   Title Pt will improve Berg Balance Score to at least 40/56 to demo decreasing fall risk    Baseline 33/56    Time 4    Period Weeks    Status New    Target Date 03/09/21      PT SHORT TERM GOAL #4   Title PT will assess and create appropriate DGI goal    Baseline 14/24 baseline    Time 2     Period Weeks    Status Achieved    Target Date 02/23/21      PT SHORT TERM GOAL #5   Title Pt will have improved TUG score to </=13 sec to demo reduced fall risk    Time 4    Period Weeks    Status New    Target Date 03/09/21               PT Long Term Goals - 02/23/21 1236       PT LONG TERM GOAL #1   Title Pt will be able to return to a consistent exercise regimen at home    Time 8    Period Weeks    Status New    Target Date 04/06/21      PT LONG TERM GOAL #2   Title Pt will improve Berg Balance Score to at least 45/56 for decreased fall risk    Time 8    Period Weeks    Status New    Target Date 04/06/21      PT LONG TERM GOAL #3   Title Pt will improve 5x STS to </=15 sec to demo improved functional LE strength and balance    Time 8    Period Weeks    Status New    Target Date 04/06/21      PT LONG TERM GOAL #4   Title Pt will be able to perform floor to stand transfers with SBA    Time 8    Period Weeks    Status New    Target Date 04/06/21      PT LONG TERM GOAL #5   Title Pt will improve DGI by 4 points to indicate decreased falls risk in community    Baseline 14/24    Time 8    Period Weeks    Status New    Target Date 04/06/21                   Plan - 02/23/21 1237     Clinical Impression Statement Continued to focus  on supine and standing functional LE ROM and strengthening to focus on hip extension and knee extension.  Will also benefit from exercises to facilitate trunk extension but will likely need to begin in supine.  Performed DGI with pt demonstrating high falls risk: 14/24.  Will continue to address and progress towards LTG.    Personal Factors and Comorbidities Age;Time since onset of injury/illness/exacerbation;Past/Current Experience    Examination-Activity Limitations Locomotion Level;Bend;Hygiene/Grooming;Stand;Squat;Transfers;Toileting    Examination-Participation Restrictions Community Activity;Shop     Stability/Clinical Decision Making Evolving/Moderate complexity    Rehab Potential Good    PT Frequency 2x / week    PT Duration 8 weeks    PT Treatment/Interventions ADLs/Self Care Home Management;Aquatic Therapy;Moist Heat;Cryotherapy;DME Instruction;Gait training;Stair training;Functional mobility training;Therapeutic activities;Therapeutic exercise;Balance training;Neuromuscular re-education;Manual techniques;Patient/family education;Passive range of motion;Dry needling;Taping    PT Next Visit Plan Use visual demonstration and keep cues simple and functional.  Continue functional LE stretching and strengthening focusing on trunk extension, hip and knee extension.  Standing balance, Dynamic gait and endurance.    PT Home Exercise Plan Access Code: [email or web address]    Consulted and Agree with Plan of Care Patient;Family member/caregiver    Family Member Consulted Niece             Patient will benefit from skilled therapeutic intervention in order to improve the following deficits and impairments:  Abnormal gait, Decreased range of motion, Decreased coordination, Difficulty walking, Decreased endurance, Decreased activity tolerance, Decreased balance, Hypomobility, Decreased mobility, Decreased strength, Postural dysfunction  Visit Diagnosis: Difficulty in walking, not elsewhere classified  Muscle weakness (generalized)  Other abnormalities of gait and mobility  Abnormal posture     Problem List There are no problems to display for this patient.   Dierdre Highman, PT, DPT 02/23/21    12:41 PM    [facility] [address] [place], [place], [address] Phone: [phone]   Fax:  [phone]  Name: Lee Mckenzie MRN: [id number] Date of Birth: 05-31-1950

## 2021-02-28 ENCOUNTER — Ambulatory Visit: Payer: Medicare Other | Admitting: Physical Therapy

## 2021-02-28 ENCOUNTER — Other Ambulatory Visit: Payer: Self-pay

## 2021-02-28 DIAGNOSIS — M6281 Muscle weakness (generalized): Secondary | ICD-10-CM

## 2021-02-28 DIAGNOSIS — R262 Difficulty in walking, not elsewhere classified: Secondary | ICD-10-CM | POA: Diagnosis not present

## 2021-02-28 DIAGNOSIS — R2689 Other abnormalities of gait and mobility: Secondary | ICD-10-CM

## 2021-02-28 DIAGNOSIS — R293 Abnormal posture: Secondary | ICD-10-CM

## 2021-02-28 NOTE — Therapy (Signed)
[facility] [address] [place], [place], [address] Phone: [phone]   Fax:  [phone]  Physical Therapy Treatment  Patient Details  Name: Lee Mckenzie MRN: [id number] Date of Birth: 09/09/1950 Referring Provider (PT): Fleet Contras, MD   Encounter Date: 02/28/2021   PT End of Session - 02/28/21 1021     Visit Number 4    Number of Visits 16    Date for PT Re-Evaluation 04/06/21    Authorization Type Medicare -- PN Visit 10    Progress Note Due on Visit 10    PT Start Time 1020    PT Stop Time 1101    PT Time Calculation (min) 41 min    Equipment Utilized During Treatment Gait belt    Activity Tolerance Patient tolerated treatment well    Behavior During Therapy WFL for tasks assessed/performed             Past Medical History:  Diagnosis Date   Seizures ([facility])     No past surgical history on file.  There were no vitals filed for this visit.   Subjective Assessment - 02/28/21 1022     Subjective "Just trying to make it."  Achy all over. Trying exercises at home and they are going okay.    Patient is accompained by: Family member   niece   Pertinent History Epilepsy    Limitations Walking;Standing;House hold activities    How long can you sit comfortably? n/a    How long can you stand comfortably? n/a    How long can you walk comfortably? Can go to [employer] and through parking lot but now needs to hold on to cart (did not need the cart previously)    Patient Stated Goals Improve balance and sitting to standing    Currently in Pain? Yes   Achy all over                              [facility] Adult PT Treatment/Exercise - 02/28/21 1026       Bed Mobility   Bed Mobility Sit to Sidelying Left;Rolling Right;Supine to Sit    Rolling Right Minimal Assistance - Patient > 75%;2 Helpers    Supine to Sit Minimal Assistance - Patient > 75%    Sit to Sidelying Left  Minimal Assistance - Patient > 75%;2 Helpers      Exercises   Exercises Other Exercises;Shoulder;Neck    Other Exercises  Attempted supine shoulder retractions for improved posture and anterior chest stretch, max verbal and tactile cues provided but pt unable to comprehend. Performed supine open/closed books w/boomwhackers for upper thoracic spine mobility for improved posture and stretch of anterior shoulders, x10 per side. Max verbal and visual cues for performance. While seated at edge of mat, pt performed 10 B shoulder abductions w/proud chest to stretch anterior shoulder and chest and promote upright posture.      Lumbar Exercises: Stretches   Other Lumbar Stretch Exercise Supine windshield wipers    Other Lumbar Stretch Exercise Supine windshield wipers w/B feet propped on green physioball. x10 reps per side with 5s isometric hold for lower lumbar stretch. Min A to stabilize feet on ball, max verbal cues for initiation and motor planning      Lumbar Exercises: Aerobic   Other Aerobic Exercise SciFit level 1.3 w/BLE/BUE for 6 mins for dynamic cardiovascular warmup and BLE/BUE coordination      Lumbar  Exercises: Seated   Sit to Stand 10 reps    Sit to Stand Limitations Using BUEs and B shoulder abduction at top of movement for psotural control. Max visual cues for body position, anterior weight shifting and glute activation for improved BLE strength and upper cross stretch for improved posture and transfers. Min A throughout      Lumbar Exercises: Supine   Bridge 10 reps    Bridge Limitations min assist, max verbal and tactile cues throughout      Knee/Hip Exercises: Supine   Quad Sets Strengthening;Both;Right;Left;1 set;Limitations    Quad Sets Limitations Single leg propped on pillow, max verbal and tactile cues provided for max quad activation. x15 reps per side w/6s isometric hold for improved strengthening, stretching of HS and initiation    Engineer, production  Limitations Straight leg hamstring bridges for isolated hip extension, hamstrin activation and stretch on green physioball. X15 reps w/min A for stabilization of feet on ball, max verbal cues for motor planning and initiation                       PT Short Term Goals - 02/23/21 1236       PT SHORT TERM GOAL #1   Title Pt will be able to perform HEP with family supervision    Time 4    Period Weeks    Status New    Target Date 03/09/21      PT SHORT TERM GOAL #2   Title Pt will improve 5x STS to </=20 sec    Baseline 28.34    Time 4    Period Weeks    Status New    Target Date 03/09/21      PT SHORT TERM GOAL #3   Title Pt will improve Berg Balance Score to at least 40/56 to demo decreasing fall risk    Baseline 33/56    Time 4    Period Weeks    Status New    Target Date 03/09/21      PT SHORT TERM GOAL #4   Title PT will assess and create appropriate DGI goal    Baseline 14/24 baseline    Time 2    Period Weeks    Status Achieved    Target Date 02/23/21      PT SHORT TERM GOAL #5   Title Pt will have improved TUG score to </=13 sec to demo reduced fall risk    Time 4    Period Weeks    Status New    Target Date 03/09/21               PT Long Term Goals - 02/23/21 1236       PT LONG TERM GOAL #1   Title Pt will be able to return to a consistent exercise regimen at home    Time 8    Period Weeks    Status New    Target Date 04/06/21      PT LONG TERM GOAL #2   Title Pt will improve Berg Balance Score to at least 45/56 for decreased fall risk    Time 8    Period Weeks    Status New    Target Date 04/06/21      PT LONG TERM GOAL #3   Title Pt will improve 5x STS to </=15 sec to demo improved functional LE strength and balance    Time 8  Period Weeks    Status New    Target Date 04/06/21      PT LONG TERM GOAL #4   Title Pt will be able to perform floor to stand transfers with SBA    Time 8    Period Weeks    Status New     Target Date 04/06/21      PT LONG TERM GOAL #5   Title Pt will improve DGI by 4 points to indicate decreased falls risk in community    Baseline 14/24    Time 8    Period Weeks    Status New    Target Date 04/06/21                   Plan - 02/28/21 1106     Clinical Impression Statement Emphasis of session on improved posture, BLE strength/stretching w/emphasis on glutes and HS. Attempted to initiate shoulder retraction in supine, but pt unable to follow visual, verbal and tactile cues. Pt very sef-limiting and stated he did not give 100% throughout session. Pt will continue to benefit from skilled PT to address kyphotic posture, deficits with gait and safety awareness.    Personal Factors and Comorbidities Age;Time since onset of injury/illness/exacerbation;Past/Current Experience    Examination-Activity Limitations Locomotion Level;Bend;Hygiene/Grooming;Stand;Squat;Transfers;Toileting    Examination-Participation Restrictions Community Activity;Shop    Stability/Clinical Decision Making Evolving/Moderate complexity    Rehab Potential Good    PT Frequency 2x / week    PT Duration 8 weeks    PT Treatment/Interventions ADLs/Self Care Home Management;Aquatic Therapy;Moist Heat;Cryotherapy;DME Instruction;Gait training;Stair training;Functional mobility training;Therapeutic activities;Therapeutic exercise;Balance training;Neuromuscular re-education;Manual techniques;Patient/family education;Passive range of motion;Dry needling;Taping    PT Next Visit Plan Use visual demonstration and keep cues simple and functional. Aerobic warmup on SciFit. Continue functional LE stretching and strengthening focusing on trunk extension, hip and knee extension.  Standing balance, Dynamic gait and endurance.    PT Home Exercise Plan Access Code: [email or web address]    Consulted and Agree with Plan of Care Patient;Family member/caregiver    Family Member Consulted Niece             Patient will benefit  from skilled therapeutic intervention in order to improve the following deficits and impairments:  Abnormal gait, Decreased range of motion, Decreased coordination, Difficulty walking, Decreased endurance, Decreased activity tolerance, Decreased balance, Hypomobility, Decreased mobility, Decreased strength, Postural dysfunction  Visit Diagnosis: Difficulty in walking, not elsewhere classified  Muscle weakness (generalized)  Other abnormalities of gait and mobility  Abnormal posture     Problem List There are no problems to display for this patient.   Drake Leach, PT, DPT 02/28/2021, 11:12 AM  [facility] [address] [place], [place], [address] Phone: [phone]   Fax:  [phone]  Name: Lee Mckenzie MRN: [id number] Date of Birth: [birth year]

## 2021-03-02 ENCOUNTER — Ambulatory Visit: Payer: Medicare Other | Admitting: Physical Therapy

## 2021-03-02 ENCOUNTER — Other Ambulatory Visit: Payer: Self-pay

## 2021-03-02 ENCOUNTER — Encounter: Payer: Self-pay | Admitting: Physical Therapy

## 2021-03-02 DIAGNOSIS — M6281 Muscle weakness (generalized): Secondary | ICD-10-CM

## 2021-03-02 DIAGNOSIS — R293 Abnormal posture: Secondary | ICD-10-CM

## 2021-03-02 DIAGNOSIS — R262 Difficulty in walking, not elsewhere classified: Secondary | ICD-10-CM

## 2021-03-02 DIAGNOSIS — R2689 Other abnormalities of gait and mobility: Secondary | ICD-10-CM

## 2021-03-02 NOTE — Therapy (Signed)
[facility] [address] [place], [place], [address] Phone: [phone]   Fax:  [phone]  Physical Therapy Treatment  Patient Details  Name: Lee Mckenzie MRN: [id number] Date of Birth: [birth year] Referring Provider (PT): Fleet Contras, MD   Encounter Date: 03/02/2021   PT End of Session - 03/02/21 1020     Visit Number 5    Number of Visits 16    Date for PT Re-Evaluation 04/06/21    Authorization Type Medicare -- PN Visit 10    Progress Note Due on Visit 10    PT Start Time 1019    PT Stop Time 1100    PT Time Calculation (min) 41 min    Equipment Utilized During Treatment Gait belt    Activity Tolerance Patient tolerated treatment well    Behavior During Therapy WFL for tasks assessed/performed             Past Medical History:  Diagnosis Date   Seizures (HCC)     History reviewed. No pertinent surgical history.  There were no vitals filed for this visit.   Subjective Assessment - 03/02/21 1021     Subjective "Just trying to make it."  No new changes    Patient is accompained by: Family member   niece   Pertinent History Epilepsy    Limitations Walking;Standing;House hold activities    How long can you sit comfortably? n/a    How long can you stand comfortably? n/a    How long can you walk comfortably? Can go to [employer] and through parking lot but now needs to hold on to cart (did not need the cart previously)    Patient Stated Goals Improve balance and sitting to standing    Currently in Pain? Yes   Achey all over                              [facility] Adult PT Treatment/Exercise - 03/02/21 1023       Bed Mobility   Bed Mobility Supine to Sit;Sit to Supine    Supine to Sit Minimal Assistance - Patient > 75%   needs max cues for sequencing   Sit to Supine Minimal Assistance - Patient > 75%;2 Helpers   needs max cues for sequencing     Balance   Balance  Assessed Yes      Dynamic Standing Balance   Reaching for objects comments: In // bars, mass practice cross-body reaching to yellow cone at various heights, unilateral UE support on rail. Max concurrent verbal cues for motor planning, visual scanning and sequencing. Min A throughout      Lumbar Exercises: Stretches   Other Lumbar Stretch Exercise Supine windshield wipers    Other Lumbar Stretch Exercise Feet on mat, BLEs bent to 45 degrees for ioslated lumbar stretch. Added hip adduction w/pillow for targeted hip stretch. x10 each side w/10-15s hold. Max verbal cues to maintain adduction      Lumbar Exercises: Aerobic   Other Aerobic Exercise SciFit level 1.3 w/BLE/BUE for 6 mins for dynamic cardiovascular warmup and BLE/BUE coordination      Lumbar Exercises: Seated   Sit to Stand 5 reps    Sit to Stand Limitations Using BUEs and mod tacitle cues for anterior weight shift      Knee/Hip Exercises: Stretches   Active Hamstring Stretch Both;Limitations    Active Hamstring Stretch Limitations Supine SAQ w/bolster, 5s  isometric hold, x10 per side    Other Knee/Hip Stretches Staggered stance HS stretch    Other Knee/Hip Stretches Alt. toe taps to 6" step without UE support w/10s hold for stance leg HS stretch x4 per side, min A throughout      Knee/Hip Exercises: Standing   Forward Step Up Both;Step Height: 6";Limitations    Forward Step Up Limitations Step up and downs to 6" step w/BUE support on rails, step-to pattern. x15 leading w/each LE. Max verbal cues for posture and sequencing      Knee/Hip Exercises: Supine   Bridges Strengthening;Limitations    Bridges Limitations BLEs bent to 45 degrees x10 for isolated hip extension                       PT Short Term Goals - 02/23/21 1236       PT SHORT TERM GOAL #1   Title Pt will be able to perform HEP with family supervision    Time 4    Period Weeks    Status New    Target Date 03/09/21      PT SHORT TERM GOAL #2    Title Pt will improve 5x STS to </=20 sec    Baseline 28.34    Time 4    Period Weeks    Status New    Target Date 03/09/21      PT SHORT TERM GOAL #3   Title Pt will improve Berg Balance Score to at least 40/56 to demo decreasing fall risk    Baseline 33/56    Time 4    Period Weeks    Status New    Target Date 03/09/21      PT SHORT TERM GOAL #4   Title PT will assess and create appropriate DGI goal    Baseline 14/24 baseline    Time 2    Period Weeks    Status Achieved    Target Date 02/23/21      PT SHORT TERM GOAL #5   Title Pt will have improved TUG score to </=13 sec to demo reduced fall risk    Time 4    Period Weeks    Status New    Target Date 03/09/21               PT Long Term Goals - 02/23/21 1236       PT LONG TERM GOAL #1   Title Pt will be able to return to a consistent exercise regimen at home    Time 8    Period Weeks    Status New    Target Date 04/06/21      PT LONG TERM GOAL #2   Title Pt will improve Berg Balance Score to at least 45/56 for decreased fall risk    Time 8    Period Weeks    Status New    Target Date 04/06/21      PT LONG TERM GOAL #3   Title Pt will improve 5x STS to </=15 sec to demo improved functional LE strength and balance    Time 8    Period Weeks    Status New    Target Date 04/06/21      PT LONG TERM GOAL #4   Title Pt will be able to perform floor to stand transfers with SBA    Time 8    Period Weeks    Status New    Target  Date 04/06/21      PT LONG TERM GOAL #5   Title Pt will improve DGI by 4 points to indicate decreased falls risk in community    Baseline 14/24    Time 8    Period Weeks    Status New    Target Date 04/06/21                   Plan - 03/02/21 1022     Clinical Impression Statement Today's skilled session focused on posture/BLE stretches, BLE strengthening and standing balance tasks. Pt taking incr time to perform bed mobility and has incr fear when going from sit >  supine. Needs reassurance cues from pt's niece and therapist. Pt needing max multimodal cues for standing balance with trunk rotations and SLS tasks. Will continue to progress towards LTGs.    Personal Factors and Comorbidities Age;Time since onset of injury/illness/exacerbation;Past/Current Experience    Examination-Activity Limitations Locomotion Level;Bend;Hygiene/Grooming;Stand;Squat;Transfers;Toileting    Examination-Participation Restrictions Community Activity;Shop    Stability/Clinical Decision Making Evolving/Moderate complexity    Rehab Potential Good    PT Frequency 2x / week    PT Duration 8 weeks    PT Treatment/Interventions ADLs/Self Care Home Management;Aquatic Therapy;Moist Heat;Cryotherapy;DME Instruction;Gait training;Stair training;Functional mobility training;Therapeutic activities;Therapeutic exercise;Balance training;Neuromuscular re-education;Manual techniques;Patient/family education;Passive range of motion;Dry needling;Taping    PT Next Visit Plan Use visual demonstration and keep cues simple and functional. Aerobic warmup on SciFit. Continue functional LE stretching and strengthening focusing on trunk extension, hip and knee extension.  Standing balance, Dynamic gait and endurance.    PT Home Exercise Plan Access Code: [email or web address]    Consulted and Agree with Plan of Care Patient;Family member/caregiver    Family Member Consulted Niece             Patient will benefit from skilled therapeutic intervention in order to improve the following deficits and impairments:  Abnormal gait, Decreased range of motion, Decreased coordination, Difficulty walking, Decreased endurance, Decreased activity tolerance, Decreased balance, Hypomobility, Decreased mobility, Decreased strength, Postural dysfunction  Visit Diagnosis: Difficulty in walking, not elsewhere classified  Muscle weakness (generalized)  Other abnormalities of gait and mobility  Abnormal posture     Problem  List There are no problems to display for this patient.   Drake Leach, PT, DPT  03/02/2021, 11:55 AM  [facility] [address] [place], [place], [address] Phone: [phone]   Fax:  [phone]  Name: Lee Mckenzie MRN: [id number] Date of Birth: Mar 06, 1950

## 2021-03-07 ENCOUNTER — Ambulatory Visit: Payer: Medicare Other | Admitting: Physical Therapy

## 2021-03-07 ENCOUNTER — Encounter: Payer: Self-pay | Admitting: Physical Therapy

## 2021-03-07 ENCOUNTER — Other Ambulatory Visit: Payer: Self-pay

## 2021-03-07 VITALS — BP 128/73 | HR 94

## 2021-03-07 DIAGNOSIS — R262 Difficulty in walking, not elsewhere classified: Secondary | ICD-10-CM | POA: Diagnosis not present

## 2021-03-07 DIAGNOSIS — R2689 Other abnormalities of gait and mobility: Secondary | ICD-10-CM

## 2021-03-07 DIAGNOSIS — M6281 Muscle weakness (generalized): Secondary | ICD-10-CM

## 2021-03-07 NOTE — Therapy (Addendum)
[facility] [address] [place], [place], [address] Phone: [phone]   Fax:  [phone]  Physical Therapy Treatment  Patient Details  Name: Lee Mckenzie MRN: [id number] Date of Birth: 19-Feb-1950 Referring Provider (PT): Nolene Ebbs, MD   Encounter Date: 03/07/2021   PT End of Session - 03/07/21 1054     Visit Number 6    Number of Visits 16    Date for PT Re-Evaluation 04/06/21    Authorization Type Medicare -- PN Visit 10    Progress Note Due on Visit 10    PT Start Time 1018    PT Stop Time 1058    PT Time Calculation (min) 40 min    Equipment Utilized During Treatment Gait belt    Activity Tolerance Patient tolerated treatment well    Behavior During Therapy WFL for tasks assessed/performed             Past Medical History:  Diagnosis Date   Seizures ([place])     History reviewed. No pertinent surgical history.  Vitals:   03/07/21 1022  BP: 128/73  Pulse: 94     Subjective Assessment - 03/07/21 1020     Subjective Pt's niece reporting that he may be going to an Adult Day Care in the fall. No falls.    Patient is accompained by: Family member   niece   Pertinent History Epilepsy    Limitations Walking;Standing;House hold activities    How long can you sit comfortably? n/a    How long can you stand comfortably? n/a    How long can you walk comfortably? Can go to [employer] and through parking lot but now needs to hold on to cart (did not need the cart previously)    Patient Stated Goals Improve balance and sitting to standing    Currently in Pain? Yes   little headache               [facility] PT Assessment - 03/07/21 1023       Timed Up and Go Test   TUG Normal TUG    Normal TUG (seconds) 13.13   no AD                          [facility] Adult PT Treatment/Exercise - 03/07/21 1023       Transfers   Transfers Sit to Stand;Stand to Sit    Sit to Stand  5: Supervision    Five time sit to stand comments  23.81 seconds with BUE support from mat table.    Stand to Sit 5: Supervision      Lumbar Exercises: Aerobic   Other Aerobic Exercise SciFit level 1.5 w/BLE/BUE for 6 mins for dynamic cardiovascular warmup and BLE/BUE coordination. Cues for full ROM      Knee/Hip Exercises: Standing   Forward Step Up Both;Step Height: 6";Limitations    Forward Step Up Limitations Step up and downs to 6" step w/BUE support on rails, step-to pattern. x5 each side. Max verbal cues for posture and sequencing                 Balance Exercises - 03/07/21 1048       Balance Exercises: Standing   Standing Eyes Opened Wide (BOA);Foam/compliant surface;Limitations    Standing Eyes Opened Limitations On air ex, 4 x 30 seconds. Performed in corner, pt needing intermittent taps to chair/wall for balance. Verbal/tactile cues throughout for posture.  Pt unable to understand cues to squeeze his glutes for hip extension.    Sidestepping 5 reps;Foam/compliant support;Limitations    Sidestepping Limitations On blue mat down and back x5 reps, needing max cues to stay forward towards counter, tactile cues to keep pelvis rotated forward   Other Standing Exercises In corner with feet apart > more narrow BOS; ball toss with pt's niece x10 reps, gradually working to throw ball slightly overhead for trunk extension. Needing wall at times for balance.                PT Education - 03/07/21 1234     Education Details Progress towards goals.    Person(s) Educated Patient   niece   Methods Explanation    Comprehension Verbalized understanding              PT Short Term Goals - 03/07/21 1036       PT SHORT TERM GOAL #1   Title Pt will be able to perform HEP with family supervision    Time 4    Period Weeks    Status New    Target Date 03/09/21      PT SHORT TERM GOAL #2   Title Pt will improve 5x STS to </=20 sec    Baseline 28.34; 23.81 seconds with  BUE support from mat table on 03/07/21    Time 4    Period Weeks    Status Not Met    Target Date 03/09/21      PT SHORT TERM GOAL #3   Title Pt will improve Berg Balance Score to at least 40/56 to demo decreasing fall risk    Baseline 33/56    Time 4    Period Weeks    Status New    Target Date 03/09/21      PT SHORT TERM GOAL #4   Title PT will assess and create appropriate DGI goal    Baseline 14/24 baseline    Time 2    Period Weeks    Status Achieved    Target Date 02/23/21      PT SHORT TERM GOAL #5   Title Pt will have improved TUG score to </=13 sec to demo reduced fall risk    Baseline 13.13 seconds on 03/07/21    Time 4    Period Weeks    Status Partially Met    Target Date 03/09/21               PT Long Term Goals - 02/23/21 1236       PT LONG TERM GOAL #1   Title Pt will be able to return to a consistent exercise regimen at home    Time 8    Period Weeks    Status New    Target Date 04/06/21      PT LONG TERM GOAL #2   Title Pt will improve Berg Balance Score to at least 45/56 for decreased fall risk    Time 8    Period Weeks    Status New    Target Date 04/06/21      PT LONG TERM GOAL #3   Title Pt will improve 5x STS to </=15 sec to demo improved functional LE strength and balance    Time 8    Period Weeks    Status New    Target Date 04/06/21      PT LONG TERM GOAL #4   Title Pt will be able to  perform floor to stand transfers with SBA    Time 8    Period Weeks    Status New    Target Date 04/06/21      PT LONG TERM GOAL #5   Title Pt will improve DGI by 4 points to indicate decreased falls risk in community    Baseline 14/24    Time 8    Period Weeks    Status New    Target Date 04/06/21                   Plan - 03/07/21 1300     Clinical Impression Statement Began to check pt's STGs today. Pt did not meet STGs #2 and #5. Pt with improvement of TUG and 5x sit <> stand, but not quite to goal level. Pt improved 5x  sit <> stand to 23.81 seconds (previously 28.34) with BUE support and TUG to 13.13 seconds (previously ~17 seconds). Pt's TUG time indicates a decr fall risk. Remainder of session focused on standing balance and BLE strengthening. Pt needing multimodal cues for proper technique. Will continue to progress towards LTGs    Personal Factors and Comorbidities Age;Time since onset of injury/illness/exacerbation;Past/Current Experience    Examination-Activity Limitations Locomotion Level;Bend;Hygiene/Grooming;Stand;Squat;Transfers;Toileting    Examination-Participation Restrictions Community Activity;Shop    Stability/Clinical Decision Making Evolving/Moderate complexity    Rehab Potential Good    PT Frequency 2x / week    PT Duration 8 weeks    PT Treatment/Interventions ADLs/Self Care Home Management;Aquatic Therapy;Moist Heat;Cryotherapy;DME Instruction;Gait training;Stair training;Functional mobility training;Therapeutic activities;Therapeutic exercise;Balance training;Neuromuscular re-education;Manual techniques;Patient/family education;Passive range of motion;Dry needling;Taping    PT Next Visit Plan check remainder of STGs. Use visual demonstration and keep cues simple and functional. Aerobic warmup on SciFit. Continue functional LE stretching and strengthening focusing on trunk extension, hip and knee extension.  Standing balance, Dynamic gait and endurance.    PT Home Exercise Plan Access Code: [email or web address]    Consulted and Agree with Plan of Care Patient;Family member/caregiver    Family Member Consulted Niece             Patient will benefit from skilled therapeutic intervention in order to improve the following deficits and impairments:  Abnormal gait, Decreased range of motion, Decreased coordination, Difficulty walking, Decreased endurance, Decreased activity tolerance, Decreased balance, Hypomobility, Decreased mobility, Decreased strength, Postural dysfunction  Visit Diagnosis: Difficulty  in walking, not elsewhere classified  Muscle weakness (generalized)  Other abnormalities of gait and mobility     Problem List There are no problems to display for this patient.   Arliss Journey, PT, DPT  03/07/2021, 1:15 PM  [facility] [address] [place], [place], [address] Phone: [phone]   Fax:  [phone]  Name: Lee Mckenzie MRN: [id number] Date of Birth: March 17, 1950

## 2021-03-09 ENCOUNTER — Other Ambulatory Visit: Payer: Self-pay

## 2021-03-09 ENCOUNTER — Ambulatory Visit: Payer: Medicare Other

## 2021-03-09 DIAGNOSIS — R2689 Other abnormalities of gait and mobility: Secondary | ICD-10-CM

## 2021-03-09 DIAGNOSIS — R293 Abnormal posture: Secondary | ICD-10-CM

## 2021-03-09 DIAGNOSIS — M6281 Muscle weakness (generalized): Secondary | ICD-10-CM

## 2021-03-09 DIAGNOSIS — R262 Difficulty in walking, not elsewhere classified: Secondary | ICD-10-CM | POA: Diagnosis not present

## 2021-03-09 NOTE — Therapy (Signed)
[facility] [address] [place], [place], [address] Phone: [phone]   Fax:  [phone]  Physical Therapy Treatment  Patient Details  Name: Lee Mckenzie MRN: [id number] Date of Birth: December 14, 1950 Referring Provider (PT): Nolene Ebbs, MD   Encounter Date: 03/09/2021   PT End of Session - 03/09/21 1014     Visit Number 7    Number of Visits 16    Date for PT Re-Evaluation 04/06/21    Authorization Type Medicare -- PN Visit 10    Progress Note Due on Visit 10    PT Start Time 7062    PT Stop Time 1100    PT Time Calculation (min) 45 min    Equipment Utilized During Treatment Gait belt    Activity Tolerance Patient tolerated treatment well    Behavior During Therapy [facility] for tasks assessed/performed             Past Medical History:  Diagnosis Date   Seizures ([place])     History reviewed. No pertinent surgical history.  There were no vitals filed for this visit.   Subjective Assessment - 03/09/21 1014     Subjective Patient reports no new changes/complaints. No falls.    Patient is accompained by: Family member   niece   Pertinent History Epilepsy    Limitations Walking;Standing;House hold activities    How long can you sit comfortably? n/a    How long can you stand comfortably? n/a    How long can you walk comfortably? Can go to [employer] and through parking lot but now needs to hold on to cart (did not need the cart previously)    Patient Stated Goals Improve balance and sitting to standing    Currently in Pain? Yes   reports intermittent pain in the neck               [facility] PT Assessment - 03/09/21 0001       Standardized Balance Assessment   Standardized Balance Assessment Berg Balance Test      Berg Balance Test   Sit to Stand Able to stand  independently using hands    Standing Unsupported Able to stand safely 2 minutes   cues for upright posture; reports fatigue    Sitting with Back Unsupported but Feet Supported on Floor or Stool Able to sit safely and securely 2 minutes    Stand to Sit Controls descent by using hands    Transfers Able to transfer safely, definite need of hands    Standing Unsupported with Eyes Closed Able to stand 10 seconds safely    Standing Unsupported with Feet Together Able to place feet together independently and stand for 1 minute with supervision    From Standing, Reach Forward with Outstretched Arm Can reach forward >12 cm safely (5")   6"   From Standing Position, Pick up Object from Floor Able to pick up shoe, needs supervision    From Standing Position, Turn to Look Behind Over each Shoulder Turn sideways only but maintains balance    Turn 360 Degrees Able to turn 360 degrees safely but slowly    Standing Unsupported, Alternately Place Feet on Step/Stool Able to complete 4 steps without aid or supervision    Standing Unsupported, One Foot in Front Able to take small step independently and hold 30 seconds    Standing on One Leg Tries to lift leg/unable to hold 3 seconds but remains standing independently  Total Score 39    Berg comment: 39/56              [facility] Adult PT Treatment/Exercise - 03/09/21 0001       Exercises   Exercises Knee/Hip      Lumbar Exercises: Aerobic   Other Aerobic Exercise SciFit level 2.5 w/BLE/BUE for 6 mins for dynamic cardiovascular warmup and BLE/BUE coordination. Cues for full ROM. Pt tolerating increase in resistance well.             Completed review of the following exercise during session:  Access Code: [email or web address] URL: [email or web address] Date: 03/09/2021 Prepared by: Baldomero Lamy  Exercises Sidelying Thoracic Rotation with Open Book - 1 x daily - 7 x weekly - 1 sets - 10 reps - increased cues required, as initially completing supine, improvements noted with sidelying position.  Clamshell - 1 x daily - 7 x weekly - 1 sets - 20 reps - completed properly;  will benefit from trial of resistive band at next session.  Beginner Bridge - 1 x daily - 7 x weekly - 3 sets - 10 reps - able to complete correctly with minimal cueing    Due to time unable to review these during session, will complete at next session:  Seated Hamstring Stretch (BKA) - 1 x daily - 7 x weekly - 2 sets - 30 sec hold         PT Education - 03/09/21 1100     Education Details Berg Balance Score; HEP Review    Person(s) Educated Patient;Other (comment)   Niece   Methods Explanation;Demonstration    Comprehension Returned demonstration;Verbalized understanding;Verbal cues required              PT Short Term Goals - 03/09/21 1043       PT SHORT TERM GOAL #1   Title Pt will be able to perform HEP with family supervision    Baseline reviewed, niece reports independence with family assistance    Time 4    Period Weeks    Status Achieved    Target Date 03/09/21      PT SHORT TERM GOAL #2   Title Pt will improve 5x STS to </=20 sec    Baseline 28.34; 23.81 seconds with BUE support from mat table on 03/07/21    Time 4    Period Weeks    Status Not Met    Target Date 03/09/21      PT SHORT TERM GOAL #3   Title Pt will improve Berg Balance Score to at least 40/56 to demo decreasing fall risk    Baseline 33/56; 39/56    Time 4    Period Weeks    Status Partially Met    Target Date 03/09/21      PT SHORT TERM GOAL #4   Title PT will assess and create appropriate DGI goal    Baseline 14/24 baseline    Time 2    Period Weeks    Status Achieved    Target Date 02/23/21      PT SHORT TERM GOAL #5   Title Pt will have improved TUG score to </=13 sec to demo reduced fall risk    Baseline 13.13 seconds on 03/07/21    Time 4    Period Weeks    Status Partially Met    Target Date 03/09/21               PT Long Term Goals - 02/23/21 1236  PT LONG TERM GOAL #1   Title Pt will be able to return to a consistent exercise regimen at home    Time 8     Period Weeks    Status New    Target Date 04/06/21      PT LONG TERM GOAL #2   Title Pt will improve Berg Balance Score to at least 45/56 for decreased fall risk    Time 8    Period Weeks    Status New    Target Date 04/06/21      PT LONG TERM GOAL #3   Title Pt will improve 5x STS to </=15 sec to demo improved functional LE strength and balance    Time 8    Period Weeks    Status New    Target Date 04/06/21      PT LONG TERM GOAL #4   Title Pt will be able to perform floor to stand transfers with SBA    Time 8    Period Weeks    Status New    Target Date 04/06/21      PT LONG TERM GOAL #5   Title Pt will improve DGI by 4 points to indicate decreased falls risk in community    Baseline 14/24    Time 8    Period Weeks    Status New    Target Date 04/06/21                   Plan - 03/09/21 1100     Clinical Impression Statement Today's skilled PT session focused on finishing assesment of patient's progress toward STGs. With [employer], patient scoring 39/56 today, just shy of goal level. Reviewed entire HEP with patient, with cues required for proper completion of sidelying trunk rotation. Pt tolerating activites well with cues, will progress clamshell at next session. Will continue per POC.    Personal Factors and Comorbidities Age;Time since onset of injury/illness/exacerbation;Past/Current Experience    Examination-Activity Limitations Locomotion Level;Bend;Hygiene/Grooming;Stand;Squat;Transfers;Toileting    Examination-Participation Restrictions Community Activity;Shop    Stability/Clinical Decision Making Evolving/Moderate complexity    Rehab Potential Good    PT Frequency 2x / week    PT Duration 8 weeks    PT Treatment/Interventions ADLs/Self Care Home Management;Aquatic Therapy;Moist Heat;Cryotherapy;DME Instruction;Gait training;Stair training;Functional mobility training;Therapeutic activities;Therapeutic exercise;Balance training;Neuromuscular  re-education;Manual techniques;Patient/family education;Passive range of motion;Dry needling;Taping    PT Next Visit Plan Use visual demonstration and keep cues simple and functional. Aerobic warmup on SciFit. Upgrade clamshell to resistance, trial stretching. Update HEP handout. Continue functional LE stretching and strengthening focusing on trunk extension, hip and knee extension.  Standing balance, Dynamic gait and endurance.    PT Home Exercise Plan Access Code: [email or web address]    Consulted and Agree with Plan of Care Patient;Family member/caregiver    Family Member Consulted Niece             Patient will benefit from skilled therapeutic intervention in order to improve the following deficits and impairments:  Abnormal gait, Decreased range of motion, Decreased coordination, Difficulty walking, Decreased endurance, Decreased activity tolerance, Decreased balance, Hypomobility, Decreased mobility, Decreased strength, Postural dysfunction  Visit Diagnosis: Difficulty in walking, not elsewhere classified  Muscle weakness (generalized)  Other abnormalities of gait and mobility  Abnormal posture     Problem List There are no problems to display for this patient.   Jones Bales, PT, DPT 03/09/2021, 12:43 PM  [facility] [address]  [place], [place], [address] Phone: [phone]   Fax:  [phone]  Name: Lee Mckenzie MRN: [id number] Date of Birth: 10/29/1950

## 2021-03-09 NOTE — Patient Instructions (Signed)
Access Code: [email or web address] URL: [email or web address] Date: 03/09/2021 Prepared by: Jethro Bastos  Exercises Sidelying Thoracic Rotation with Open Book - 1 x daily - 7 x weekly - 1 sets - 10 reps Clamshell - 1 x daily - 7 x weekly - 1 sets - 20 reps Beginner Bridge - 1 x daily - 7 x weekly - 3 sets - 10 reps Seated Hamstring Stretch (BKA) - 1 x daily - 7 x weekly - 2 sets - 30 sec hold

## 2021-03-12 ENCOUNTER — Encounter: Payer: Self-pay | Admitting: Physical Therapy

## 2021-03-12 ENCOUNTER — Ambulatory Visit: Payer: Medicare Other | Admitting: Physical Therapy

## 2021-03-12 ENCOUNTER — Other Ambulatory Visit: Payer: Self-pay

## 2021-03-12 DIAGNOSIS — R262 Difficulty in walking, not elsewhere classified: Secondary | ICD-10-CM

## 2021-03-12 DIAGNOSIS — R2689 Other abnormalities of gait and mobility: Secondary | ICD-10-CM

## 2021-03-12 DIAGNOSIS — M6281 Muscle weakness (generalized): Secondary | ICD-10-CM

## 2021-03-12 NOTE — Patient Instructions (Signed)
Access Code: [email or web address] URL: [email or web address] Date: 03/12/2021 Prepared by: Elease Etienne  Exercises Sidelying Thoracic Rotation with Open Book - 1 x daily - 7 x weekly - 1 sets - 10 reps Clamshell - 1 x daily - 7 x weekly - 1 sets - 20 reps Beginner Bridge - 1 x daily - 7 x weekly - 3 sets - 10 reps Seated Hamstring Stretch (BKA) - 1 x daily - 7 x weekly - 2 sets - 30 sec hold Clamshell with Resistance - 1 x daily - 7 x weekly - 1 sets - 20 reps-yellow resistance band

## 2021-03-12 NOTE — Therapy (Signed)
[facility] [address] [place], [place], [address] Phone: [phone]   Fax:  [phone]  Physical Therapy Treatment  Patient Details  Name: Lee Mckenzie MRN: [id number] Date of Birth: May 28, 1950 Referring Provider (PT): Nolene Ebbs, MD   Encounter Date: 03/12/2021   PT End of Session - 03/12/21 1155     Visit Number 8    Number of Visits 16    Date for PT Re-Evaluation 04/06/21    Authorization Type Medicare -- PN Visit 10    Progress Note Due on Visit 10    PT Start Time 1102    PT Stop Time 1143    PT Time Calculation (min) 41 min    Equipment Utilized During Treatment Gait belt    Activity Tolerance Patient tolerated treatment well    Behavior During Therapy WFL for tasks assessed/performed             Past Medical History:  Diagnosis Date   Seizures ([place])     History reviewed. No pertinent surgical history.  There were no vitals filed for this visit.   Subjective Assessment - 03/12/21 1106     Subjective Had a little bit of a cold this weekend, but is otherwise fine now.  No changes.    Patient is accompained by: Family member   niece   Pertinent History Epilepsy    Limitations Walking;Standing;House hold activities    How long can you sit comfortably? n/a    How long can you stand comfortably? n/a    How long can you walk comfortably? Can go to [employer] and through parking lot but now needs to hold on to cart (did not need the cart previously)    Patient Stated Goals Improve balance and sitting to standing    Currently in Pain? Yes    Pain Score 2     Pain Location Back    Pain Orientation Lower    Pain Descriptors / Indicators Aching    Pain Type Chronic pain                               [facility] Adult PT Treatment/Exercise - 03/12/21 1109       Neuro Re-ed    Neuro Re-ed Details  In // bars:  pt standing performing alternating toe taps to  therastone w/ inc cues for LE control to promote tap vs step.  Progressed to taps to 2 therastones to promote crossing midline, requires visual demonstration.  Pt requires CGA without UE support.  Pt standing with unilateral UE support>no UE support requiring CGA while reaching across midline to retrieve bean bag and toss at target 1 meter away.  2 trials performed bilaterally.      Exercises   Exercises Other Exercises    Other Exercises  SciFit level 2.5 for 6 mins using BUE/BLE for cardiovascular endurance, steps/min ranging from 80-90s.  Pt doing better maintaining full LE ROM until last 1 min d/t fatigue requiring min cues to promote engagement.  Reviewed hamstring stretch using stool vs w/o.  Trialed hamstring stretch 2x30sec each side w/o step under foot, 1x30sec each side with 4" step under foot using forward reach for ease of cuing.  Provided visual demonstration for pt and niece for positioning modifications to correctly engage hamstrings.  Pt in supine performing 1x20 clamshells w/ yellow theraband, updated HEP.  Pt requires verbal and tactile cues for positioning  on mat.                       PT Short Term Goals - 03/09/21 1043       PT SHORT TERM GOAL #1   Title Pt will be able to perform HEP with family supervision    Baseline reviewed, niece reports independence with family assistance    Time 4    Period Weeks    Status Achieved    Target Date 03/09/21      PT SHORT TERM GOAL #2   Title Pt will improve 5x STS to </=20 sec    Baseline 28.34; 23.81 seconds with BUE support from mat table on 03/07/21    Time 4    Period Weeks    Status Not Met    Target Date 03/09/21      PT SHORT TERM GOAL #3   Title Pt will improve Berg Balance Score to at least 40/56 to demo decreasing fall risk    Baseline 33/56; 39/56    Time 4    Period Weeks    Status Partially Met    Target Date 03/09/21      PT SHORT TERM GOAL #4   Title PT will assess and create appropriate DGI  goal    Baseline 14/24 baseline    Time 2    Period Weeks    Status Achieved    Target Date 02/23/21      PT SHORT TERM GOAL #5   Title Pt will have improved TUG score to </=13 sec to demo reduced fall risk    Baseline 13.13 seconds on 03/07/21    Time 4    Period Weeks    Status Partially Met    Target Date 03/09/21               PT Long Term Goals - 02/23/21 1236       PT LONG TERM GOAL #1   Title Pt will be able to return to a consistent exercise regimen at home    Time 8    Period Weeks    Status New    Target Date 04/06/21      PT LONG TERM GOAL #2   Title Pt will improve Berg Balance Score to at least 45/56 for decreased fall risk    Time 8    Period Weeks    Status New    Target Date 04/06/21      PT LONG TERM GOAL #3   Title Pt will improve 5x STS to </=15 sec to demo improved functional LE strength and balance    Time 8    Period Weeks    Status New    Target Date 04/06/21      PT LONG TERM GOAL #4   Title Pt will be able to perform floor to stand transfers with SBA    Time 8    Period Weeks    Status New    Target Date 04/06/21      PT LONG TERM GOAL #5   Title Pt will improve DGI by 4 points to indicate decreased falls risk in community    Baseline 14/24    Time 8    Period Weeks    Status New    Target Date 04/06/21                   Plan - 03/12/21 1155     Clinical  Impression Statement Spent time reviewing stretching in HEP with communication to niece about modifications for cuing and positioning.  Continued with standing balance activities in parallel bars to challenge normal base of support when moving extremitites across midline.  Pt did well with visual demonstration and frequent cuing.    Personal Factors and Comorbidities Age;Time since onset of injury/illness/exacerbation;Past/Current Experience    Examination-Activity Limitations Locomotion Level;Bend;Hygiene/Grooming;Stand;Squat;Transfers;Toileting     Examination-Participation Restrictions Community Activity;Shop    Stability/Clinical Decision Making Evolving/Moderate complexity    Rehab Potential Good    PT Frequency 2x / week    PT Duration 8 weeks    PT Treatment/Interventions ADLs/Self Care Home Management;Aquatic Therapy;Moist Heat;Cryotherapy;DME Instruction;Gait training;Stair training;Functional mobility training;Therapeutic activities;Therapeutic exercise;Balance training;Neuromuscular re-education;Manual techniques;Patient/family education;Passive range of motion;Dry needling;Taping    PT Next Visit Plan Use visual demonstration and keep cues simple and functional. Aerobic warmup on SciFit. Upgrade clamshell to resistance, trial stretching. Update HEP handout. Continue functional LE stretching and strengthening focusing on trunk extension, hip and knee extension.  Standing balance, Dynamic gait and endurance.    PT Home Exercise Plan Access Code: [email or web address]    Consulted and Agree with Plan of Care Patient;Family member/caregiver    Family Member Consulted Niece             Patient will benefit from skilled therapeutic intervention in order to improve the following deficits and impairments:  Abnormal gait, Decreased range of motion, Decreased coordination, Difficulty walking, Decreased endurance, Decreased activity tolerance, Decreased balance, Hypomobility, Decreased mobility, Decreased strength, Postural dysfunction  Visit Diagnosis: Difficulty in walking, not elsewhere classified  Muscle weakness (generalized)  Other abnormalities of gait and mobility     Problem List There are no problems to display for this patient.   Bary Richard, PT, DPT 03/12/2021, 12:18 PM  [facility] [address] [place], [place], [address] Phone: [phone]   Fax:  [phone]  Name: Lee Mckenzie MRN: [id number] Date of Birth: [birth year]

## 2021-03-15 ENCOUNTER — Encounter: Payer: Self-pay | Admitting: Physical Therapy

## 2021-03-15 ENCOUNTER — Ambulatory Visit: Payer: Medicare Other | Attending: Internal Medicine | Admitting: Physical Therapy

## 2021-03-15 ENCOUNTER — Other Ambulatory Visit: Payer: Self-pay

## 2021-03-15 DIAGNOSIS — R262 Difficulty in walking, not elsewhere classified: Secondary | ICD-10-CM | POA: Insufficient documentation

## 2021-03-15 DIAGNOSIS — R293 Abnormal posture: Secondary | ICD-10-CM | POA: Insufficient documentation

## 2021-03-15 DIAGNOSIS — R2689 Other abnormalities of gait and mobility: Secondary | ICD-10-CM | POA: Insufficient documentation

## 2021-03-15 DIAGNOSIS — M6281 Muscle weakness (generalized): Secondary | ICD-10-CM | POA: Diagnosis present

## 2021-03-15 DIAGNOSIS — R2681 Unsteadiness on feet: Secondary | ICD-10-CM | POA: Diagnosis present

## 2021-03-15 NOTE — Patient Instructions (Signed)
Access Code: [email or web address] ?URL: [email or web address] ?Date: 03/15/2021 ?Prepared by: Camille Bal ? ?Exercises ?Sidelying Thoracic Rotation with Open Book - 1 x daily - 7 x weekly - 1 sets - 10 reps ?Clamshell - 1 x daily - 7 x weekly - 1 sets - 20 reps ?Beginner Bridge - 1 x daily - 7 x weekly - 3 sets - 10 reps ?Seated Hamstring Stretch (BKA) - 1 x daily - 7 x weekly - 2 sets - 30 sec hold ?Clamshell with Resistance - 1 x daily - 7 x weekly - 1 sets - 20 reps ?Sit to Stand with Resistance Around Legs - 1 x daily - 7 x weekly - 1 sets - 10 reps ?

## 2021-03-16 NOTE — Therapy (Signed)
[facility] ?[place], [place], [address] ?Phone: [phone]   Fax:  [phone] ? ?Physical Therapy Treatment ? ?Patient Details  ?Name: Lee Mckenzie ?MRN: [id number] ?Date of Birth: 02/26/1950 ?Referring Provider (PT): Nolene Ebbs, MD ? ? ?Encounter Date: 03/15/2021 ? ? PT End of Session - 03/15/21 1018   ? ? Visit Number 9   ? Number of Visits 16   ? Date for PT Re-Evaluation 04/06/21   ? Authorization Type Medicare -- PN Visit 10   ? Progress Note Due on Visit 10   ? PT Start Time 1018   ? PT Stop Time 1059   ? PT Time Calculation (min) 41 min   ? Equipment Utilized During Treatment Gait belt   ? Activity Tolerance Patient tolerated treatment well   ? Behavior During Therapy [facility] for tasks assessed/performed   ? ?  ?  ? ?  ? ? ?Past Medical History:  ?Diagnosis Date  ? Seizures ([place])   ? ? ?History reviewed. No pertinent surgical history. ? ?There were no vitals filed for this visit. ? ? Subjective Assessment - 03/15/21 1018   ? ? Subjective No falls, no changes per niece.   ? Patient is accompained by: Family member   niece  ? Pertinent History Epilepsy   ? Limitations Walking;Standing;House hold activities   ? How long can you sit comfortably? n/a   ? How long can you stand comfortably? n/a   ? How long can you walk comfortably? Can go to [employer] and through parking lot but now needs to hold on to cart (did not need the cart previously)   ? Patient Stated Goals Improve balance and sitting to standing   ? Currently in Pain? No/denies   ? ?  ?  ? ?  ? ? ? ? ? ? ? ? ? ? ? ? ? ? ? ? ? ? ? ? [facility] Adult PT Treatment/Exercise - 03/15/21 1020   ? ?  ? Neuro Re-ed   ? Neuro Re-ed Details  Pt standing EOM performing several bouts of reach to side with ipsilateral hand to retrieve bean bag then toss to chest height target across midline.  Progressed to wide semi-tandem with reach across midline with ipsilateral and contralateral  reach with toss to multiple targets at varying heights at and across midline.  Pt requires increased cuing, no LOB.   ?  ? Exercises  ? Exercises Knee/Hip   ? Other Exercises  Seating hamstring stretch w/ 4" step 2x30 sec each leg.  Low back stretch with physioball rollout x6 forward, L &R.  Pt did well with 1 step commands and tactile cuing to deepen stretch.  Pt began to self pace activity after initial reps and cuing.  Standing trunk rotations x8 each side>standing open book x5 with verbal cues for reach.   ?  ? Lumbar Exercises: Aerobic  ? Other Aerobic Exercise SciFit level 2.7 for 6 mins using BUE/BLE 70-80 reps per min throughout.  Pt cued for maintaining pace.   ? ?  ?  ? ?  ? ? ? ? ? ? ? ? ? ? ? ? PT Short Term Goals - 03/09/21 1043   ? ?  ? PT SHORT TERM GOAL #1  ? Title Pt will be able to perform HEP with family supervision   ? Baseline reviewed, niece reports independence with family assistance   ? Time 4   ? Period Weeks   ?  Status Achieved   ? Target Date 03/09/21   ?  ? PT SHORT TERM GOAL #2  ? Title Pt will improve 5x STS to </=20 sec   ? Baseline 28.34; 23.81 seconds with BUE support from mat table on 03/07/21   ? Time 4   ? Period Weeks   ? Status Not Met   ? Target Date 03/09/21   ?  ? PT SHORT TERM GOAL #3  ? Title Pt will improve Berg Balance Score to at least 40/56 to demo decreasing fall risk   ? Baseline 33/56; 39/56   ? Time 4   ? Period Weeks   ? Status Partially Met   ? Target Date 03/09/21   ?  ? PT SHORT TERM GOAL #4  ? Title PT will assess and create appropriate DGI goal   ? Baseline 14/24 baseline   ? Time 2   ? Period Weeks   ? Status Achieved   ? Target Date 02/23/21   ?  ? PT SHORT TERM GOAL #5  ? Title Pt will have improved TUG score to </=13 sec to demo reduced fall risk   ? Baseline 13.13 seconds on 03/07/21   ? Time 4   ? Period Weeks   ? Status Partially Met   ? Target Date 03/09/21   ? ?  ?  ? ?  ? ? ? ? PT Long Term Goals - 02/23/21 1236   ? ?  ? PT LONG TERM GOAL #1  ? Title  Pt will be able to return to a consistent exercise regimen at home   ? Time 8   ? Period Weeks   ? Status New   ? Target Date 04/06/21   ?  ? PT LONG TERM GOAL #2  ? Title Pt will improve Berg Balance Score to at least 45/56 for decreased fall risk   ? Time 8   ? Period Weeks   ? Status New   ? Target Date 04/06/21   ?  ? PT LONG TERM GOAL #3  ? Title Pt will improve 5x STS to </=15 sec to demo improved functional LE strength and balance   ? Time 8   ? Period Weeks   ? Status New   ? Target Date 04/06/21   ?  ? PT LONG TERM GOAL #4  ? Title Pt will be able to perform floor to stand transfers with SBA   ? Time 8   ? Period Weeks   ? Status New   ? Target Date 04/06/21   ?  ? PT LONG TERM GOAL #5  ? Title Pt will improve DGI by 4 points to indicate decreased falls risk in community   ? Baseline 14/24   ? Time 8   ? Period Weeks   ? Status New   ? Target Date 04/06/21   ? ?  ?  ? ?  ? ? ? ? ? ? ? ? Plan - 03/15/21 1018   ? ? Clinical Impression Statement Pt did well with progression of balance tasks with use of varying BOS and increased weight shift demand of activity.  Verbally reviewed HEP with pt and niece and progressed stretching and strengtheing in session.  He required frequent cuing and demonstrationg throughout.   ? Personal Factors and Comorbidities Age;Time since onset of injury/illness/exacerbation;Past/Current Experience   ? Examination-Activity Limitations Locomotion Level;Bend;Hygiene/Grooming;Stand;Squat;Transfers;Toileting   ? Examination-Participation Restrictions Community Activity;Shop   ? Stability/Clinical Decision Making Evolving/Moderate  complexity   ? Rehab Potential Good   ? PT Frequency 2x / week   ? PT Duration 8 weeks   ? PT Treatment/Interventions ADLs/Self Care Home Management;Aquatic Therapy;Moist Heat;Cryotherapy;DME Instruction;Gait training;Stair training;Functional mobility training;Therapeutic activities;Therapeutic exercise;Balance training;Neuromuscular re-education;Manual  techniques;Patient/family education;Passive range of motion;Dry needling;Taping   ? PT Next Visit Plan Use visual demonstration and keep cues simple and functional. Aerobic warmup on SciFit. Upgrade clamshell to resistance, trial stretching. Update HEP handout. Continue functional LE stretching and strengthening focusing on trunk extension, hip and knee extension.  Standing balance, Dynamic gait and endurance.   ? PT Home Exercise Plan Access Code: [email or web address]   ? Consulted and Agree with Plan of Care Patient;Family member/caregiver   ? Family Member Consulted Niece   ? ?  ?  ? ?  ? ? ?Patient will benefit from skilled therapeutic intervention in order to improve the following deficits and impairments:  Abnormal gait, Decreased range of motion, Decreased coordination, Difficulty walking, Decreased endurance, Decreased activity tolerance, Decreased balance, Hypomobility, Decreased mobility, Decreased strength, Postural dysfunction ? ?Visit Diagnosis: ?Other abnormalities of gait and mobility ? ?Muscle weakness (generalized) ? ? ? ? ?Problem List ?There are no problems to display for this patient. ? ? ?Bary Richard, PT, DPT ?03/16/2021, 8:27 AM ? ?[facility] ?[facility] [place] ?[place], [place], [address] ?Phone: [phone]   Fax:  [phone] ? ?Name: Mervil Wacker ?MRN: [id number] ?Date of Birth: 01-Aug-1950 ? ? ? ?

## 2021-03-21 ENCOUNTER — Ambulatory Visit: Payer: Medicare Other | Admitting: Physical Therapy

## 2021-03-21 ENCOUNTER — Encounter: Payer: Self-pay | Admitting: Physical Therapy

## 2021-03-21 ENCOUNTER — Other Ambulatory Visit: Payer: Self-pay

## 2021-03-21 DIAGNOSIS — R2689 Other abnormalities of gait and mobility: Secondary | ICD-10-CM

## 2021-03-21 DIAGNOSIS — R293 Abnormal posture: Secondary | ICD-10-CM

## 2021-03-21 DIAGNOSIS — R262 Difficulty in walking, not elsewhere classified: Secondary | ICD-10-CM

## 2021-03-21 DIAGNOSIS — M6281 Muscle weakness (generalized): Secondary | ICD-10-CM

## 2021-03-21 NOTE — Therapy (Signed)
?OUTPATIENT PHYSICAL THERAPY TREATMENT NOTE/10th VISIT PROGRESS NOTE  ? ? ?Patient Name: Lee Mckenzie ?MRN: [id number] ?DOB:[birth year], 71 y.o., male ?Today's Date: 03/21/2021 ? ?10th Visit Physical Therapy Progress Note ? ?Dates of Reporting Period: 02/09/21 to 03/21/21 ? ? ?PCP: Lee Ebbs, MD ?REFERRING PROVIDER: Nolene Ebbs, MD ? ? PT End of Session - 03/21/21 1022   ? ? Visit Number 10   ? Number of Visits 16   ? Date for PT Re-Evaluation 04/06/21   ? Authorization Type Medicare -- PN Visit 10   ? Progress Note Due on Visit 10   ? PT Start Time 1019   ? PT Stop Time 1059   ? PT Time Calculation (min) 40 min   ? Equipment Utilized During Treatment Gait belt   ? Activity Tolerance Patient tolerated treatment well   ? Behavior During Therapy [facility] for tasks assessed/performed   ? ?  ?  ? ?  ? ? ?Past Medical History:  ?Diagnosis Date  ? Seizures ([place])   ? ?History reviewed. No pertinent surgical history. ?There are no problems to display for this patient. ? ? ?REFERRING DIAG: R27.0 (ICD-10-CM) - Ataxia R26.9 (ICD-10-CM) - Gait disorder R26.89 (ICD-10-CM) - Balance disorder  ? ?THERAPY DIAG:  ?Other abnormalities of gait and mobility ? ?Difficulty in walking, not elsewhere classified ? ?Abnormal posture ? ?Muscle weakness (generalized) ? ?PERTINENT HISTORY: Epilepsy,  developmental delays.  ? ?PRECAUTIONS: Fall  ? ?SUBJECTIVE: They are looking at getting [name] into a day program.  Pt's niece reports that it is hard to get the pt motivated to do his exercises at home.  ? ?PAIN:  ?Are you having pain? Yes " has a little bit of back pain"  ? ? ?TODAY'S TREATMENT:  ?SciFit with BUE/BLE at gear 2.5 for 8 minutes for strengthening, ROM, activity tolerance.  ? ? ?Access Code: [email or web address] ?URL: [email or web address] ?Date: 03/21/2021 ?Prepared by: Janann August ? ?Exercises ?Sidelying Thoracic Rotation with Open Book - 1 x daily - 7 x weekly - 1 sets - 10 reps ?Beginner Bridge - 1 x daily - 7 x  weekly - 3 sets - 10 reps ?Seated Hamstring Stretch (BKA) - 1 x daily - 7 x weekly - 2 sets - 30 sec hold ?Clamshell with Resistance - 1 x daily - 7 x weekly - 1 sets - 20 reps ? ?New/updated additions on 03/21/21:  ?Sit to Stand with Armchair - 2 x daily - 5 x weekly - 1-2 sets - 10 reps - modified to perform sit <> stands without tband around thighs to standing with arms extended to work on posture. Performed 2 sets of 10 reps. Trialed performing with band around thighs for hip ABD and pt performing with too narrow BOS and unable to follow cueing to push out into band. ? ?Standing Hip Abduction with Counter Support - 2 x daily - 5 x weekly - 2 sets - 10 reps  performed alternating legs for hip ABD/SLS ?Standing Single Leg Stance with Counter Support - 2 x daily - 5 x weekly - 3 sets - 15 hold  ? ? ?BALANCE:  ?Attempted heel raises at countertop- pt demonstrated incr shoulder elevated and incr knee flexion when attempting to perform, deferred exercise.  ?Standing trunk rotations: holding ball and wide BOS twisting to bring ball to therapist's hand x10 reps each side, verbal/demo/visual cues throughout for proper technique and ROM for rotation. Cued to try not to move feet. Pt fatigued easily with this and needed a  seated rest break between sets.  ? ?PATIENT EDUCATION: ?Education details: Discussed POC going forwards - will plan for another ~2 weeks of therapy as main goal will be to get pt with a good HEP that he can work on with family support at home as pt is limited in what he can do in therapy due to cognition, discussed looking into getting a gym membership for pt to use a machine like the SciFit for strength/ROM/and aerobic activity. Pt's niece to look into this.  ?Person educated: Patient and Pt's niece ?Education method: Explanation, Demonstration, and Handouts ?Education comprehension: verbalized understanding and returned demonstration ? ? ?[place]: ?[email or web address] ? ? PT Short Term Goals - 03/09/21  1043   ? ?  ? PT SHORT TERM GOAL #1  ? Title Pt will be able to perform HEP with family supervision   ? Baseline reviewed, niece reports independence with family assistance   ? Time 4   ? Period Weeks   ? Status Achieved   ? Target Date 03/09/21   ?  ? PT SHORT TERM GOAL #2  ? Title Pt will improve 5x STS to </=20 sec   ? Baseline 28.34; 23.81 seconds with BUE support from mat table on 03/07/21   ? Time 4   ? Period Weeks   ? Status Not Met   ? Target Date 03/09/21   ?  ? PT SHORT TERM GOAL #3  ? Title Pt will improve Berg Balance Score to at least 40/56 to demo decreasing fall risk   ? Baseline 33/56; 39/56   ? Time 4   ? Period Weeks   ? Status Partially Met   ? Target Date 03/09/21   ?  ? PT SHORT TERM GOAL #4  ? Title PT will assess and create appropriate DGI goal   ? Baseline 14/24 baseline   ? Time 2   ? Period Weeks   ? Status Achieved   ? Target Date 02/23/21   ?  ? PT SHORT TERM GOAL #5  ? Title Pt will have improved TUG score to </=13 sec to demo reduced fall risk   ? Baseline 13.13 seconds on 03/07/21   ? Time 4   ? Period Weeks   ? Status Partially Met   ? Target Date 03/09/21   ? ?  ?  ? ?  ? ? ? PT Long Term Goals - 02/23/21 1236   ? ?  ? PT LONG TERM GOAL #1  ? Title Pt will be able to return to a consistent exercise regimen at home   ? Time 8   ? Period Weeks   ? Status New   ? Target Date 04/06/21   ?  ? PT LONG TERM GOAL #2  ? Title Pt will improve Berg Balance Score to at least 45/56 for decreased fall risk   ? Time 8   ? Period Weeks   ? Status New   ? Target Date 04/06/21   ?  ? PT LONG TERM GOAL #3  ? Title Pt will improve 5x STS to </=15 sec to demo improved functional LE strength and balance   ? Time 8   ? Period Weeks   ? Status New   ? Target Date 04/06/21   ?  ? PT LONG TERM GOAL #4  ? Title Pt will be able to perform floor to stand transfers with SBA   ? Time 8   ? Period Weeks   ?  Status New   ? Target Date 04/06/21   ?  ? PT LONG TERM GOAL #5  ? Title Pt will improve DGI by 4 points to  indicate decreased falls risk in community   ? Baseline 14/24   ? Time 8   ? Period Weeks   ? Status New   ? Target Date 04/06/21   ? ?  ?  ? ?  ? ? ? Plan - 03/21/21 1332   ? ? Clinical Impression Statement 10th visit PN: STGs assessed on 03/09/21. Pt did not meet/partially met 3 out of 5 LTGs. Pt improved BERG score to a 39/56 (previously 33/56), but not to goal level. Pt improved 5x sit <> stand with BUE support to 23.81 seconds (previously 28.34 seconds). Today's session worked on adding standing SLS tasks at the countertop to [employer] for balance with family supervision. Pt able to tolerate and follow directions well with these. Will continue to progress towards LTGs.   ? Personal Factors and Comorbidities Age;Time since onset of injury/illness/exacerbation;Past/Current Experience   ? Examination-Activity Limitations Locomotion Level;Bend;Hygiene/Grooming;Stand;Squat;Transfers;Toileting   ? Examination-Participation Restrictions Community Activity;Shop   ? Stability/Clinical Decision Making Evolving/Moderate complexity   ? Rehab Potential Good   ? PT Frequency 2x / week   ? PT Duration 8 weeks   ? PT Treatment/Interventions ADLs/Self Care Home Management;Aquatic Therapy;Moist Heat;Cryotherapy;DME Instruction;Gait training;Stair training;Functional mobility training;Therapeutic activities;Therapeutic exercise;Balance training;Neuromuscular re-education;Manual techniques;Patient/family education;Passive range of motion;Dry needling;Taping   ? PT Next Visit Plan Use visual demonstration and keep cues simple and functional. Plan to D/C before 5/27 (when pt's cert ends). Due to cognition i think best thing would be just to make sure pt has a good HEP.  Aerobic warmup on SciFit.  Continue functional LE stretching and strengthening focusing on trunk extension, hip and knee extension.  Standing balance, Dynamic gait and endurance.   ? PT Home Exercise Plan Access Code: [email or web address]   ? Consulted and Agree with Plan of Care  Patient;Family member/caregiver   ? Family Member Consulted Niece   ? ?  ?  ? ?  ? ? ? ? ?Arliss Journey, PT, DPT  ?03/21/2021, 1:36 PM ? ?  ? ?

## 2021-03-23 ENCOUNTER — Encounter: Payer: Self-pay | Admitting: Physical Therapy

## 2021-03-23 ENCOUNTER — Ambulatory Visit: Payer: Medicare Other | Admitting: Physical Therapy

## 2021-03-23 ENCOUNTER — Other Ambulatory Visit: Payer: Self-pay

## 2021-03-23 DIAGNOSIS — R293 Abnormal posture: Secondary | ICD-10-CM

## 2021-03-23 DIAGNOSIS — R2681 Unsteadiness on feet: Secondary | ICD-10-CM

## 2021-03-23 DIAGNOSIS — M6281 Muscle weakness (generalized): Secondary | ICD-10-CM

## 2021-03-23 DIAGNOSIS — R2689 Other abnormalities of gait and mobility: Secondary | ICD-10-CM

## 2021-03-23 NOTE — Therapy (Signed)
OUTPATIENT PHYSICAL THERAPY TREATMENT NOTE   Patient Name: Lee Mckenzie MRN: [id number] DOB:1950/07/22, 71 y.o., male Today's Date: 03/23/2021   PCP: Nolene Ebbs, MD REFERRING PROVIDER: Nolene Ebbs, MD   PT End of Session - 03/23/21 1432     Visit Number 11    Number of Visits 16    Date for PT Re-Evaluation 04/06/21    Authorization Type Medicare -- PN Visit 10    Progress Note Due on Visit 10    PT Start Time 1020    PT Stop Time 1100    PT Time Calculation (min) 40 min    Equipment Utilized During Treatment Gait belt    Activity Tolerance Patient tolerated treatment well    Behavior During Therapy WFL for tasks assessed/performed              Past Medical History:  Diagnosis Date   Seizures ([place])    History reviewed. No pertinent surgical history. There are no problems to display for this patient.   REFERRING DIAG: R27.0 (ICD-10-CM) - Ataxia R26.9 (ICD-10-CM) - Gait disorder R26.89 (ICD-10-CM) - Balance disorder   THERAPY DIAG:  Other abnormalities of gait and mobility  Muscle weakness (generalized)  Unsteadiness on feet  Abnormal posture  PERTINENT HISTORY: Epilepsy,  developmental delays.   PRECAUTIONS: Fall   SUBJECTIVE: Pt and niece state no changes or falls at home.  They are trying to do HEP.  PAIN:  Are you having pain? Yes " has a little bit of back pain"    TODAY'S TREATMENT (03/23/2021):   PT initiated floor to chair transfers with pt in quadruped on mat>press up onto elbows on bench on mat table>press up onto hands with chest up>tall kneeling on mat table without UE support>hold 30sec x6 With assist of pt niece, PT provides caregiver training on achieving half kneeling on mat to promote next part of sequence to reach chair at home.  Pt requires modA to move each LE into half kneeling with increased ER at the hip.  Pt demonstrates increased anxiousness with movement. Pt sits EOM performing elbow taps to mat to promote  ability to transition into side-lying independently at home with decreased anxiety to promote compliance to HEP.  Progressed to transition from short sit EOM to sid-lying L & R on mat table x6 each side w/ pt requiring decreased cuing and notable decrease in lability once side-lying near edge.    SciFit with BUE/BLE at gear 2.9 for 8.5 minutes for strengthening, ROM, activity tolerance.  Pt in standing with unilateral LE elevated on balance disc, using hand over hand support and CGA around hips, performing reach to ipsilateral side to retreive bean bags and toss at target below waist height.  Alternated LE elevated.    TREATMENT (03/21/2021): SciFit with BUE/BLE at gear 2.5 for 8 minutes for strengthening, ROM, activity tolerance.   Access Code: [email or web address] URL: [email or web address] Date: 03/21/2021 Prepared by: Janann August  Exercises Sidelying Thoracic Rotation with Open Book - 1 x daily - 7 x weekly - 1 sets - 10 reps Beginner Bridge - 1 x daily - 7 x weekly - 3 sets - 10 reps Seated Hamstring Stretch (BKA) - 1 x daily - 7 x weekly - 2 sets - 30 sec hold Clamshell with Resistance - 1 x daily - 7 x weekly - 1 sets - 20 reps  New/updated additions on 03/21/21:  Sit to Stand with Armchair - 2 x daily - 5 x weekly - 1-2 sets -  10 reps - modified to perform sit <> stands without tband around thighs to standing with arms extended to work on posture. Performed 2 sets of 10 reps. Trialed performing with band around thighs for hip ABD and pt performing with too narrow BOS and unable to follow cueing to push out into band.  Standing Hip Abduction with Counter Support - 2 x daily - 5 x weekly - 2 sets - 10 reps  performed alternating legs for hip ABD/SLS Standing Single Leg Stance with Counter Support - 2 x daily - 5 x weekly - 3 sets - 15 hold    BALANCE:  Attempted heel raises at countertop- pt demonstrated incr shoulder elevated and incr knee flexion when attempting to perform,  deferred exercise.  Standing trunk rotations: holding ball and wide BOS twisting to bring ball to therapist's hand x10 reps each side, verbal/demo/visual cues throughout for proper technique and ROM for rotation. Cued to try not to move feet. Pt fatigued easily with this and needed a seated rest break between sets.   PATIENT EDUCATION: Education details: Discussed floor transfers with pt and niece as family is still concerned with getting pt up off the floor at home as he has had falls in the past and his primary caregiver cannot lift him.  Discussed LTG and aim to review steps in future session to assess carryover with reality of meeting LTG.  Provided caregiver training on sequencing with edu on realities of level of assist pt will likely continue to require due to fear of falling with movement and need for one step commands and continuous visual demonstration of steps.   Person educated: Patient and Pt's niece Education method: Explanation, Demonstration, and Handouts Education comprehension: verbalized understanding and returned demonstration   HOME EXERCISE PROGRAM: [email or web address]   PT Short Term Goals - 03/09/21 1043       PT SHORT TERM GOAL #1   Title Pt will be able to perform HEP with family supervision    Baseline reviewed, niece reports independence with family assistance    Time 4    Period Weeks    Status Achieved    Target Date 03/09/21      PT SHORT TERM GOAL #2   Title Pt will improve 5x STS to </=20 sec    Baseline 28.34; 23.81 seconds with BUE support from mat table on 03/07/21    Time 4    Period Weeks    Status Not Met    Target Date 03/09/21      PT SHORT TERM GOAL #3   Title Pt will improve Berg Balance Score to at least 40/56 to demo decreasing fall risk    Baseline 33/56; 39/56    Time 4    Period Weeks    Status Partially Met    Target Date 03/09/21      PT SHORT TERM GOAL #4   Title PT will assess and create appropriate DGI goal    Baseline 14/24 baseline     Time 2    Period Weeks    Status Achieved    Target Date 02/23/21      PT SHORT TERM GOAL #5   Title Pt will have improved TUG score to </=13 sec to demo reduced fall risk    Baseline 13.13 seconds on 03/07/21    Time 4    Period Weeks    Status Partially Met    Target Date 03/09/21  PT Long Term Goals - 02/23/21 1236       PT LONG TERM GOAL #1   Title Pt will be able to return to a consistent exercise regimen at home    Time 8    Period Weeks    Status New    Target Date 04/06/21      PT LONG TERM GOAL #2   Title Pt will improve Berg Balance Score to at least 45/56 for decreased fall risk    Time 8    Period Weeks    Status New    Target Date 04/06/21      PT LONG TERM GOAL #3   Title Pt will improve 5x STS to </=15 sec to demo improved functional LE strength and balance    Time 8    Period Weeks    Status New    Target Date 04/06/21      PT LONG TERM GOAL #4   Title Pt will be able to perform floor to stand transfers with SBA    Time 8    Period Weeks    Status New    Target Date 04/06/21      PT LONG TERM GOAL #5   Title Pt will improve DGI by 4 points to indicate decreased falls risk in community    Baseline 14/24    Time 8    Period Weeks    Status New    Target Date 04/06/21              Plan - 03/21/21 1332     Clinical Impression Statement PT addressed floor to chair transfer during session with extensive edu to niece as one of caregivers on level of assist and sequencing with task.  Pt unable to complete full transfer due to visible anxiety during movements.  Finished session with aerobic exercise and static balance in SLS.  Pt making modest progress towards goals.   Personal Factors and Comorbidities Age;Time since onset of injury/illness/exacerbation;Past/Current Experience    Examination-Activity Limitations Locomotion Level;Bend;Hygiene/Grooming;Stand;Squat;Transfers;Toileting    Examination-Participation Restrictions  Community Activity;Shop    Stability/Clinical Decision Making Evolving/Moderate complexity    Rehab Potential Good    PT Frequency 2x / week    PT Duration 8 weeks    PT Treatment/Interventions ADLs/Self Care Home Management;Aquatic Therapy;Moist Heat;Cryotherapy;DME Instruction;Gait training;Stair training;Functional mobility training;Therapeutic activities;Therapeutic exercise;Balance training;Neuromuscular re-education;Manual techniques;Patient/family education;Passive range of motion;Dry needling;Taping    PT Next Visit Plan Attempt floor to mat transfer to demonstrate continued level of assist to caregiver and carryover if any w/ edu on alternate means to get out of floor - fire dept vs trying to lift him themselves.  Follow-up about outside resources/gym memberships to continue aerobic work. Use visual demonstration and keep cues simple and functional. Plan to D/C before 4/09 (when pt's cert ends). Due to cognition i think best thing would be just to make sure pt has a good HEP.  Aerobic warmup on SciFit.  Continue functional LE stretching and strengthening focusing on trunk extension, hip and knee extension.  Standing balance, Dynamic gait and endurance.    PT Home Exercise Plan Access Code: [email or web address]    Consulted and Agree with Plan of Care Patient;Family member/caregiver    Family Member Consulted Niece               Bary Richard, [place], DPT  03/23/2021, 3:10 PM

## 2021-03-27 ENCOUNTER — Other Ambulatory Visit: Payer: Self-pay

## 2021-03-27 ENCOUNTER — Ambulatory Visit: Payer: Medicare Other | Admitting: Physical Therapy

## 2021-03-27 ENCOUNTER — Encounter: Payer: Self-pay | Admitting: Physical Therapy

## 2021-03-27 DIAGNOSIS — R2689 Other abnormalities of gait and mobility: Secondary | ICD-10-CM

## 2021-03-27 DIAGNOSIS — M6281 Muscle weakness (generalized): Secondary | ICD-10-CM

## 2021-03-27 DIAGNOSIS — R2681 Unsteadiness on feet: Secondary | ICD-10-CM

## 2021-03-27 NOTE — Therapy (Signed)
?OUTPATIENT PHYSICAL THERAPY TREATMENT NOTE ? ? ?Patient Name: Lee Mckenzie ?MRN: [id number] ?DOB:1950/11/16, 71 y.o., male ?Today's Date: 03/27/2021 ? ? ?PCP: Nolene Ebbs, MD ?REFERRING PROVIDER: Nolene Ebbs, MD ? ? PT End of Session - 03/27/21 1019   ? ? Visit Number 12   ? Number of Visits 16   ? Date for PT Re-Evaluation 04/06/21   ? Authorization Type Medicare -- PN Visit 10   ? Progress Note Due on Visit 10   ? PT Start Time 1018   ? PT Stop Time 1057   ? PT Time Calculation (min) 39 min   ? Equipment Utilized During Treatment Gait belt   ? Activity Tolerance Patient tolerated treatment well   ? Behavior During Therapy [facility] for tasks assessed/performed   ? ?  ?  ? ?  ? ? ? ?Past Medical History:  ?Diagnosis Date  ? Seizures ([place])   ? ?History reviewed. No pertinent surgical history. ?There are no problems to display for this patient. ? ? ?REFERRING DIAG: R27.0 (ICD-10-CM) - Ataxia R26.9 (ICD-10-CM) - Gait disorder R26.89 (ICD-10-CM) - Balance disorder  ? ?THERAPY DIAG:  ?Other abnormalities of gait and mobility ? ?Muscle weakness (generalized) ? ?Unsteadiness on feet ? ?PERTINENT HISTORY: Epilepsy,  developmental delays.  ? ?PRECAUTIONS: Fall  ? ?SUBJECTIVE: No falls. Pt's niece reports that pt is doing more for himself at home and his balance is getting better.  ? ?PAIN:  ?Are you having pain? Yes " has a little bit of back pain"  ? ? ?TODAY'S TREATMENT (03/23/2021):  ? ? SciFit with BUE/BLE at gear 3.0 for 8 minutes for strengthening, ROM, activity tolerance. Pt's niece is looking at getting a Insurance claims handler.  ? ?Practiced floor <> stand transfers as this is a goal of the family to see if pt can safely get on/off the floor with family support if he were to have a fall - using red mat on the floor and mat table next to it. ?Pt needing demo cues on how to safely get down to the floor from half kneel > tall kneel> and cues to sitting on pt's bottom. Able to perform with min guard ?Pt  with incr fear with sitting in long sit and almost went to lay down supine on the mat, 2nd therapist providing support behind pt's trunk to keep him upright. ?From long sit - PT provided demo, single step verbal, and manual cues (for BLE placement) to help try to get pt onto his knees (even brought an 8" step by pt's side to see if he can push up from here from a lower surface before using the mat), pt with significant tightness in his hips and incr fear/anxiety and was unable to perform despite cues and max encouragement from therapists and pt's niece. Pt getting more fatigued while attempting to perform. Ultimately, 2nd PT performed fire arm carry and lifted pt dependently/max A from the floor. Discussed in the future if pt were to have a fall to make sure that they call EMS or the fire department to help get pt up.  ? ? ? ?Lower Extremity Strengthening:  ?Lateral Step Ups: RLE and LLE, 6", Sets: 1, Reps: 10 ?Forward Step Ups: RLE and LLE, 6", Sets: 1, Reps: 10 ?Needs max cues for sequencing and technique. Cues for tall posture once in standing.  ? ? ? ? ?PATIENT EDUCATION: ?Education details: Plan for D/C in 2 visits. Calling the local fire department to help pt get up  if he were to have a fall in the future as it is not safe/realistic to have family help get him up.  Ordering a gait belt from [employer] to put on pt when weather gets nicer and they go walking outside for incr safety.  ?Person educated: Patient and Pt's niece ?Education method: Explanation, Demonstration, and Handouts ?Education comprehension: verbalized understanding and returned demonstration ? ? ?[place]: ?[id number] ? ? PT Short Term Goals - 03/09/21 1043   ? ?  ? PT SHORT TERM GOAL #1  ? Title Pt will be able to perform HEP with family supervision   ? Baseline reviewed, niece reports independence with family assistance   ? Time 4   ? Period Weeks   ? Status Achieved   ? Target Date 03/09/21   ?  ? PT SHORT TERM GOAL #2  ? Title Pt  will improve 5x STS to </=20 sec   ? Baseline 28.34; 23.81 seconds with BUE support from mat table on 03/07/21   ? Time 4   ? Period Weeks   ? Status Not Met   ? Target Date 03/09/21   ?  ? PT SHORT TERM GOAL #3  ? Title Pt will improve Berg Balance Score to at least 40/56 to demo decreasing fall risk   ? Baseline 33/56; 39/56   ? Time 4   ? Period Weeks   ? Status Partially Met   ? Target Date 03/09/21   ?  ? PT SHORT TERM GOAL #4  ? Title PT will assess and create appropriate DGI goal   ? Baseline 14/24 baseline   ? Time 2   ? Period Weeks   ? Status Achieved   ? Target Date 02/23/21   ?  ? PT SHORT TERM GOAL #5  ? Title Pt will have improved TUG score to </=13 sec to demo reduced fall risk   ? Baseline 13.13 seconds on 03/07/21   ? Time 4   ? Period Weeks   ? Status Partially Met   ? Target Date 03/09/21   ? ?  ?  ? ?  ? ? ? PT Long Term Goals - 02/23/21 1236   ? ?  ? PT LONG TERM GOAL #1  ? Title Pt will be able to return to a consistent exercise regimen at home   ? Time 8   ? Period Weeks   ? Status New   ? Target Date 04/06/21   ?  ? PT LONG TERM GOAL #2  ? Title Pt will improve Berg Balance Score to at least 45/56 for decreased fall risk   ? Time 8   ? Period Weeks   ? Status New   ? Target Date 04/06/21   ?  ? PT LONG TERM GOAL #3  ? Title Pt will improve 5x STS to </=15 sec to demo improved functional LE strength and balance   ? Time 8   ? Period Weeks   ? Status New   ? Target Date 04/06/21   ?  ? PT LONG TERM GOAL #4  ? Title Pt will be able to perform floor to stand transfers with SBA   ? Time 8   ? Period Weeks   ? Status Not met  ? ?Pt needed to be dependently lifted off the floor using a fireman's carry technique. Not safe to perform at home with family due to cognition and incr anxiety/fear.   ? Target Date 04/06/21   ?  ?  PT LONG TERM GOAL #5  ? Title Pt will improve DGI by 4 points to indicate decreased falls risk in community   ? Baseline 14/24   ? Time 8   ? Period Weeks   ? Status New   ? Target  Date 04/06/21   ? ?  ?  ? ?  ? ? ? Plan - 03/21/21 1332   ? ? Clinical Impression Statement Addressed floor <> stand transfers to determine if pt would be safe to perform with family supervision/assist at home. Pt able to get down to the floor with min guard, but unable to sequence and transfer onto his knees in tall kneeling to be able to stand and push from the mat. Pt with incr fear/anxiety with being on the floor. Ultimately, one PT had to pick him up off the floor using the fireman's carry. Discussed if pt has a fall - family would need to call EMS/local fire department. Will continue to progress towards LTGs .  ? Personal Factors and Comorbidities Age;Time since onset of injury/illness/exacerbation;Past/Current Experience   ? Examination-Activity Limitations Locomotion Level;Bend;Hygiene/Grooming;Stand;Squat;Transfers;Toileting   ? Examination-Participation Restrictions Community Activity;Shop   ? Stability/Clinical Decision Making Evolving/Moderate complexity   ? Rehab Potential Good   ? PT Frequency 2x / week   ? PT Duration 8 weeks   ? PT Treatment/Interventions ADLs/Self Care Home Management;Aquatic Therapy;Moist Heat;Cryotherapy;DME Instruction;Gait training;Stair training;Functional mobility training;Therapeutic activities;Therapeutic exercise;Balance training;Neuromuscular re-education;Manual techniques;Patient/family education;Passive range of motion;Dry needling;Taping   ? PT Next Visit Plan  Plan to D/C before 6/96 (when pt's cert ends)- better to do it before pt's visit next Friday as he works with Letta Moynahan and he has only seen her once at [place] of [place]. Due to cognition i think best thing would be just to make sure pt has a good HEP.  Aerobic warmup on SciFit.  Continue functional LE stretching and strengthening focusing on trunk extension, hip and knee extension.  Standing balance, Dynamic gait and endurance.   ? PT Home Exercise Plan Access Code: [email or web address]   ? Consulted and Agree with Plan of Care  Patient;Family member/caregiver   ? Family Member Consulted Niece   ? ?  ?  ? ?  ? ? ? ? ?Arliss Journey, PT, DPT  ?03/27/2021, 12:46 PM ? ?  ? ?

## 2021-03-30 ENCOUNTER — Other Ambulatory Visit: Payer: Self-pay

## 2021-03-30 ENCOUNTER — Ambulatory Visit: Payer: Medicare Other | Admitting: Physical Therapy

## 2021-03-30 ENCOUNTER — Encounter: Payer: Self-pay | Admitting: Physical Therapy

## 2021-03-30 DIAGNOSIS — R2689 Other abnormalities of gait and mobility: Secondary | ICD-10-CM

## 2021-03-30 DIAGNOSIS — R293 Abnormal posture: Secondary | ICD-10-CM

## 2021-03-30 DIAGNOSIS — M6281 Muscle weakness (generalized): Secondary | ICD-10-CM

## 2021-03-30 DIAGNOSIS — R2681 Unsteadiness on feet: Secondary | ICD-10-CM

## 2021-03-30 NOTE — Therapy (Signed)
?OUTPATIENT PHYSICAL THERAPY TREATMENT NOTE ? ? ?Patient Name: Lee Mckenzie ?MRN: [id number] ?DOB:[birth year], 71 y.o., male ?Today's Date: 03/30/2021 ? ? ?PCP: Nolene Ebbs, MD ?REFERRING PROVIDER: Roselee Nova, MD ? ? PT End of Session - 03/30/21 1213   ? ? Visit Number 13   ? Number of Visits 16   ? Date for PT Re-Evaluation 04/06/21   ? Authorization Type Medicare -- PN Visit 10   ? Progress Note Due on Visit 10   ? PT Start Time 1020   ? PT Stop Time 1100   ? PT Time Calculation (min) 40 min   ? Equipment Utilized During Treatment Gait belt   ? Activity Tolerance Patient tolerated treatment well   ? Behavior During Therapy [facility] for tasks assessed/performed   ? ?  ?  ? ?  ? ? ? ? ?Past Medical History:  ?Diagnosis Date  ? Seizures ([place])   ? ?History reviewed. No pertinent surgical history. ?There are no problems to display for this patient. ? ? ?REFERRING DIAG: R27.0 (ICD-10-CM) - Ataxia R26.9 (ICD-10-CM) - Gait disorder R26.89 (ICD-10-CM) - Balance disorder  ? ?THERAPY DIAG:  ?Other abnormalities of gait and mobility ? ?Muscle weakness (generalized) ? ?Unsteadiness on feet ? ?Abnormal posture ? ?PERTINENT HISTORY: Epilepsy,  developmental delays.  ? ?PRECAUTIONS: Fall  ? ?SUBJECTIVE: No falls. Pt's niece reports that pt is doing more for himself at home and his balance is getting better.  ? ?PAIN:  ?Are you having pain? Yes " has a little bit of back pain"  ? ? ?TODAY'S TREATMENT  ?SciFit with BUE/BLE at 3.2 resistance for 8 mins for reciprocal movement and strength ? ?Standing performing bimanual task of unstacking cones at countertop>placing cones low<>high shelves with alternating UE, greater difficulty with tightness in LUE>added cognitive challenge with order of colored cones to move ? ?Standing alternating side trunk rotations with beige weighted ball x12 each side standing in wide semi-tandem; inc demo required ? ?Pt's niece tosses purple ball from various angles to pt to catch and toss  back for multidirectional perturbations in standing with narrowed BOS.  No LOB using CGA. ? ?Gait 230' cued to dec speed and upright posture.  Pt has tendency to lean forward with increased speed, therapist used CGA with gait belt to provide speed cues at waist. ? ? ? ? ? ? ?PATIENT EDUCATION: ?Education details: Plan for D/C in 1 visit. Re-emphasized calling the local fire department to help pt get up if he were to have a fall in the future as it is not safe/realistic to have family help get him up. ?Person educated: Patient and Pt's niece ?Education method: Explanation, Demonstration, and Handouts ?Education comprehension: verbalized understanding and returned demonstration ? ? ?[place]: ?[email or web address] ? ? PT Short Term Goals - 03/09/21 1043   ? ?  ? PT SHORT TERM GOAL #1  ? Title Pt will be able to perform HEP with family supervision   ? Baseline reviewed, niece reports independence with family assistance   ? Time 4   ? Period Weeks   ? Status Achieved   ? Target Date 03/09/21   ?  ? PT SHORT TERM GOAL #2  ? Title Pt will improve 5x STS to </=20 sec   ? Baseline 28.34; 23.81 seconds with BUE support from mat table on 03/07/21   ? Time 4   ? Period Weeks   ? Status Not Met   ? Target Date 03/09/21   ?  ?  PT SHORT TERM GOAL #3  ? Title Pt will improve Berg Balance Score to at least 40/56 to demo decreasing fall risk   ? Baseline 33/56; 39/56   ? Time 4   ? Period Weeks   ? Status Partially Met   ? Target Date 03/09/21   ?  ? PT SHORT TERM GOAL #4  ? Title PT will assess and create appropriate DGI goal   ? Baseline 14/24 baseline   ? Time 2   ? Period Weeks   ? Status Achieved   ? Target Date 02/23/21   ?  ? PT SHORT TERM GOAL #5  ? Title Pt will have improved TUG score to </=13 sec to demo reduced fall risk   ? Baseline 13.13 seconds on 03/07/21   ? Time 4   ? Period Weeks   ? Status Partially Met   ? Target Date 03/09/21   ? ?  ?  ? ?  ? ? ? PT Long Term Goals - 02/23/21 1236   ? ?  ? PT LONG TERM GOAL #1   ? Title Pt will be able to return to a consistent exercise regimen at home   ? Time 8   ? Period Weeks   ? Status New   ? Target Date 04/06/21   ?  ? PT LONG TERM GOAL #2  ? Title Pt will improve Berg Balance Score to at least 45/56 for decreased fall risk   ? Time 8   ? Period Weeks   ? Status New   ? Target Date 04/06/21   ?  ? PT LONG TERM GOAL #3  ? Title Pt will improve 5x STS to </=15 sec to demo improved functional LE strength and balance   ? Time 8   ? Period Weeks   ? Status New   ? Target Date 04/06/21   ?  ? PT LONG TERM GOAL #4  ? Title Pt will be able to perform floor to stand transfers with SBA   ? Time 8   ? Period Weeks   ? Status Not met  ? ?Pt needed to be dependently lifted off the floor using a fireman's carry technique. Not safe to perform at home with family due to cognition and incr anxiety/fear.   ? Target Date 04/06/21   ?  ? PT LONG TERM GOAL #5  ? Title Pt will improve DGI by 4 points to indicate decreased falls risk in community   ? Baseline 14/24   ? Time 8   ? Period Weeks   ? Status New   ? Target Date 04/06/21   ? ?  ?  ? ?  ? ? ? Plan - 03/21/21 1332   ? ? Clinical Impression Statement Focused skilled session on aerobic exercise tolerance and high level balance tasks in standing.  Pt continues to have difficulty remaining upright and moves quickly with all movements increasing his risk for falls.  Pt preparing to discharge next visit per POC.  ? Personal Factors and Comorbidities Age;Time since onset of injury/illness/exacerbation;Past/Current Experience   ? Examination-Activity Limitations Locomotion Level;Bend;Hygiene/Grooming;Stand;Squat;Transfers;Toileting   ? Examination-Participation Restrictions Community Activity;Shop   ? Stability/Clinical Decision Making Evolving/Moderate complexity   ? Rehab Potential Good   ? PT Frequency 2x / week   ? PT Duration 8 weeks   ? PT Treatment/Interventions ADLs/Self Care Home Management;Aquatic Therapy;Moist Heat;Cryotherapy;DME  Instruction;Gait training;Stair training;Functional mobility training;Therapeutic activities;Therapeutic exercise;Balance training;Neuromuscular re-education;Manual techniques;Patient/family education;Passive range of motion;Dry  needling;Taping   ? PT Next Visit Plan  Plan to D/C 3/21  Due to cognition i think best thing would be just to make sure pt has a good HEP.  Aerobic warmup on SciFit.  Continue functional LE stretching and strengthening focusing on trunk extension, hip and knee extension.  Standing balance, Dynamic gait and endurance.  Finalize HEP.  ? PT Home Exercise Plan Access Code: [email or web address]   ? Consulted and Agree with Plan of Care Patient;Family member/caregiver   ? Family Member Consulted Niece   ? ?  ?  ? ?  ? ? ? ? ?Bary Richard, PT, DPT  ?03/30/2021, 12:27 PM ? ?  ? ?

## 2021-04-03 ENCOUNTER — Encounter: Payer: Self-pay | Admitting: Physical Therapy

## 2021-04-03 ENCOUNTER — Other Ambulatory Visit: Payer: Self-pay

## 2021-04-03 ENCOUNTER — Ambulatory Visit: Payer: Medicare Other | Admitting: Physical Therapy

## 2021-04-03 DIAGNOSIS — R2681 Unsteadiness on feet: Secondary | ICD-10-CM

## 2021-04-03 DIAGNOSIS — R2689 Other abnormalities of gait and mobility: Secondary | ICD-10-CM | POA: Diagnosis not present

## 2021-04-03 DIAGNOSIS — M6281 Muscle weakness (generalized): Secondary | ICD-10-CM

## 2021-04-03 DIAGNOSIS — R293 Abnormal posture: Secondary | ICD-10-CM

## 2021-04-03 NOTE — Therapy (Signed)
?OUTPATIENT PHYSICAL THERAPY TREATMENT NOTE-Discharge Summary ?PHYSICAL THERAPY DISCHARGE SUMMARY ? ?Visits from Start of Care: 14 ? ?Current functional level related to goals / functional outcomes: ?Please see clinical assessment. ?  ?Remaining deficits: ?Dynamic and static balance issues, anxiety with movement, increased kyphotic posture, gait deficits ?  ?Education / Equipment: ?Edu on d/c and maintenance of gains at home.  ? ?Patient agrees to discharge. Patient goals were partially met. Patient is being discharged due to being pleased with the current functional level. ? ? ?Patient Name: Lee Mckenzie ?MRN: [id number] ?DOB:Nov 28, 1950, 71 y.o., male ?Today's Date: 04/03/2021 ? ? ?PCP: Nolene Ebbs, MD ?REFERRING PROVIDER: Nolene Ebbs, MD ? ? PT End of Session - 04/03/21 1023   ? ? Visit Number 14   ? Number of Visits 16   ? Date for PT Re-Evaluation 04/06/21   ? Authorization Type Medicare -- PN Visit 10   ? Progress Note Due on Visit 10   ? PT Start Time 1015   ? Equipment Utilized During Treatment Gait belt   ? Activity Tolerance Patient tolerated treatment well   ? Behavior During Therapy [facility] for tasks assessed/performed   ? ?  ?  ? ?  ? ? ? ? ?Past Medical History:  ?Diagnosis Date  ? Seizures ([place])   ? ?History reviewed. No pertinent surgical history. ?There are no problems to display for this patient. ? ? ?REFERRING DIAG: R27.0 (ICD-10-CM) - Ataxia R26.9 (ICD-10-CM) - Gait disorder R26.89 (ICD-10-CM) - Balance disorder  ? ?THERAPY DIAG:  ?Other abnormalities of gait and mobility ? ?Muscle weakness (generalized) ? ?Unsteadiness on feet ? ?Abnormal posture ? ?PERTINENT HISTORY: Epilepsy,  developmental delays.  ? ?PRECAUTIONS: Fall  ? ?SUBJECTIVE: No falls. Pt's niece reports that pt is doing more for himself at home and his balance is getting better.  ? ?PAIN:  ?Are you having pain? Yes " has a little bit of back pain"  ? ? ?TODAY'S TREATMENT  ?SciFit with BUE/BLE at 3.0 x18mns>3.5x5mins  resistance for 8 mins for reciprocal movement and strength, goal of 70 reps, achieved approx. Avg of 80 reps ? ?Reviewed HEP per niece request to ensure safety to continue gains at home with pt's sister as she is the primary caregiver. ? ? ?Access Code: [email or web address]?URL: [email or web address] ?Date: 04/03/2021 ?Prepared by: MElease Etienne? ?Exercises ?Sidelying Thoracic Rotation with Open Book - 1 x daily - 7 x weekly - 1 sets - 10 reps-->modified to maintain static position at chest with head rotation and lower trunk rotation for simplicity of cuing and tolerance of pt ?Beginner Bridge - 1 x daily - 7 x weekly - 3 sets - 10 reps ?Seated Hamstring Stretch (BKA) - 1 x daily - 7 x weekly - 2 sets - 30 sec hold-->modified to add chair under foot to inc stretch and improve safety with stretch at home ?Clamshell with Resistance - 1 x daily - 7 x weekly - 1 sets - 20 reps ?Sit to Stand with Armchair - 2 x daily - 5 x weekly - 1-2 sets - 10 reps; performed second set w/ one UE on knee and one pushing from mat ?Standing Hip Abduction with Counter Support - 2 x daily - 5 x weekly - 2 sets - 10 reps ?Standing Single Leg Stance with Counter Support - 2 x daily - 5 x weekly - 3 sets - 15 hold ?Supine Bridge with Resistance Band - 1 x daily - 5 x weekly - 2 sets - 10  reps-added red theraband to help control knees falling out and add additional hip strengthening ? ?5xSTS:  31.64 sec using BUE from standard chair height ?DGI:  16/24 ?BERG 44/56 ? [facility] PT Assessment - 04/03/21 1054   ? ?  ? Standardized Balance Assessment  ? Standardized Balance Assessment Berg Balance Test;Dynamic Gait Index   ?  ? Berg Balance Test  ? Sit to Stand Able to stand  independently using hands   ? Standing Unsupported Able to stand safely 2 minutes   ? Sitting with Back Unsupported but Feet Supported on Floor or Stool Able to sit safely and securely 2 minutes   ? Stand to Sit Sits safely with minimal use of hands   ? Transfers Able to  transfer safely, minor use of hands   ? Standing Unsupported with Eyes Closed Able to stand 10 seconds safely   ? Standing Unsupported with Feet Together Able to place feet together independently and stand for 1 minute with supervision   ? From Standing, Reach Forward with Outstretched Arm Can reach forward >12 cm safely (5")   ? From Standing Position, Pick up Object from Floor Able to pick up shoe, needs supervision   ? From Standing Position, Turn to Look Behind Over each Shoulder Looks behind one side only/other side shows less weight shift   Left shows less weight shift, trunk engagement  ? Turn 360 Degrees Able to turn 360 degrees safely one side only in 4 seconds or less   turns to L in <4 sec  ? Standing Unsupported, Alternately Place Feet on Step/Stool Able to complete 4 steps without aid or supervision   completes 8 steps w/ CGA  ? Standing Unsupported, One Foot in Front Able to plae foot ahead of the other independently and hold 30 seconds   ? Standing on One Leg Tries to lift leg/unable to hold 3 seconds but remains standing independently   ? Total Score 44   ?  ? Dynamic Gait Index  ? Level Surface Mild Impairment   ? Change in Gait Speed Mild Impairment   ? Gait with Horizontal Head Turns Mild Impairment   ? Gait with Vertical Head Turns Mild Impairment   ? Gait and Pivot Turn Mild Impairment   ? Step Over Obstacle Mild Impairment   ? Step Around Obstacles Mild Impairment   ? Steps Mild Impairment   step-to bilateral rails  ? Total Score 16   ? ?  ?  ? ?  ? ? ?PATIENT EDUCATION: ?Education details: Plan for D/C today.   Re-emphasized calling the local fire department to help pt get up if he were to have a fall in the future as it is not safe/realistic to have family help get him up. ?Person educated: Patient and Pt's niece ?Education method: Explanation, Demonstration, and Handouts ?Education comprehension: verbalized understanding and returned demonstration ? ? ?Plaza: ?[email or web address] ? ? PT  Short Term Goals - 03/09/21 1043   ? ?  ? PT SHORT TERM GOAL #1  ? Title Pt will be able to perform HEP with family supervision   ? Baseline reviewed, niece reports independence with family assistance   ? Time 4   ? Period Weeks   ? Status Achieved   ? Target Date 03/09/21   ?  ? PT SHORT TERM GOAL #2  ? Title Pt will improve 5x STS to </=20 sec   ? Baseline 28.34; 23.81 seconds with BUE support from mat table on  03/07/21   ? Time 4   ? Period Weeks   ? Status Not Met   ? Target Date 03/09/21   ?  ? PT SHORT TERM GOAL #3  ? Title Pt will improve Berg Balance Score to at least 40/56 to demo decreasing fall risk   ? Baseline 33/56; 39/56   ? Time 4   ? Period Weeks   ? Status Partially Met   ? Target Date 03/09/21   ?  ? PT SHORT TERM GOAL #4  ? Title PT will assess and create appropriate DGI goal   ? Baseline 14/24 baseline   ? Time 2   ? Period Weeks   ? Status Achieved   ? Target Date 02/23/21   ?  ? PT SHORT TERM GOAL #5  ? Title Pt will have improved TUG score to </=13 sec to demo reduced fall risk   ? Baseline 13.13 seconds on 03/07/21   ? Time 4   ? Period Weeks   ? Status Partially Met   ? Target Date 03/09/21   ? ?  ?  ? ?  ? ? ? PT Long Term Goals - 02/23/21 1236   ? ?  ? PT LONG TERM GOAL #1  ? Title Pt will be able to return to a consistent exercise regimen at home   ? Time 8   ? Period Weeks   ? Status Met  ? Target Date 04/06/21   ?  ? PT LONG TERM GOAL #2  ? Title Pt will improve Berg Balance Score to at least 45/56 for decreased fall risk   ? Time 8   ? Period Weeks   ? Status Partially Met; 44/56   ? Target Date 04/06/21   ?  ? PT LONG TERM GOAL #3  ? Title Pt will improve 5x STS to </=15 sec to demo improved functional LE strength and balance   ? Time 8   ? Period Weeks   ? Status Not Met  ? Target Date 04/06/21   ?  ? PT LONG TERM GOAL #4  ? Title Pt will be able to perform floor to stand transfers with SBA   ? Time 8   ? Period Weeks   ? Status Not met  ? ?Pt needed to be dependently lifted off the  floor using a fireman's carry technique. Not safe to perform at home with family due to cognition and incr anxiety/fear.   ? Target Date 04/06/21   ?  ? PT LONG TERM GOAL #5  ? Title Pt will improve DGI by 4

## 2021-04-06 ENCOUNTER — Ambulatory Visit: Payer: Medicare Other

## 2021-04-10 ENCOUNTER — Ambulatory Visit: Payer: Medicare Other | Admitting: Physical Therapy

## 2021-04-13 ENCOUNTER — Ambulatory Visit: Payer: Medicare Other | Admitting: Physical Therapy

## 2022-09-09 ENCOUNTER — Other Ambulatory Visit: Payer: Self-pay | Admitting: Internal Medicine

## 2022-09-10 LAB — COMPLETE METABOLIC PANEL WITH GFR
AG Ratio: 1.4 (calc) (ref 1.0–2.5)
ALT: 21 U/L (ref 9–46)
AST: 20 U/L (ref 10–35)
Albumin: 4 g/dL (ref 3.6–5.1)
Alkaline phosphatase (APISO): 125 U/L (ref 35–144)
BUN/Creatinine Ratio: 14 (calc) (ref 6–22)
BUN: 18 mg/dL (ref 7–25)
CO2: 23 mmol/L (ref 20–32)
Calcium: 9.2 mg/dL (ref 8.6–10.3)
Chloride: 101 mmol/L (ref 98–110)
Creat: 1.3 mg/dL — ABNORMAL HIGH (ref 0.70–1.28)
Globulin: 2.9 g/dL (ref 1.9–3.7)
Glucose, Bld: 83 mg/dL (ref 65–99)
Potassium: 4.2 mmol/L (ref 3.5–5.3)
Sodium: 136 mmol/L (ref 135–146)
Total Bilirubin: 0.4 mg/dL (ref 0.2–1.2)
Total Protein: 6.9 g/dL (ref 6.1–8.1)
eGFR: 59 mL/min/{1.73_m2} — ABNORMAL LOW (ref >=60)

## 2022-09-10 LAB — LIPID PANEL
Cholesterol: 205 mg/dL — ABNORMAL HIGH (ref <200)
HDL: 61 mg/dL (ref >=40)
LDL Cholesterol (Calc): 127 mg/dL — ABNORMAL HIGH
Non-HDL Cholesterol (Calc): 144 mg/dL — ABNORMAL HIGH (ref <130)
Total CHOL/HDL Ratio: 3.4 (calc) (ref <5.0)
Triglycerides: 80 mg/dL (ref <150)

## 2022-09-10 LAB — CBC
HCT: 41.7 % (ref 38.5–50.0)
Hemoglobin: 13.7 g/dL (ref 13.2–17.1)
MCH: 27.3 pg (ref 27.0–33.0)
MCHC: 32.9 g/dL (ref 32.0–36.0)
MCV: 83.2 fL (ref 80.0–100.0)
MPV: 11.6 fL (ref 7.5–12.5)
Platelets: 172 10*3/uL (ref 140–400)
RBC: 5.01 10*6/uL (ref 4.20–5.80)
RDW: 13.4 % (ref 11.0–15.0)
WBC: 6.7 10*3/uL (ref 3.8–10.8)

## 2022-09-10 LAB — TSH: TSH: 1.68 m[IU]/L (ref 0.40–4.50)

## 2022-09-10 LAB — PSA: PSA: 0.71 ng/mL (ref <=4.00)

## 2023-03-21 IMAGING — CT CT HEAD W/O CM
4 series · 16 of 47 positions shown, 18 images · non-contrast
Comparison: None.

CLINICAL DATA: Altered mental status.

EXAM:
CT HEAD WITHOUT CONTRAST
TECHNIQUE: Contiguous axial images were obtained from the base of the skull
through the vertex without intravenous contrast.

[Series 3: head wo · axial · 0.47mm/px · z∈[-144,+6]mm · 7 of 41 slices shown, 9 images]
[im 6/41  brain]
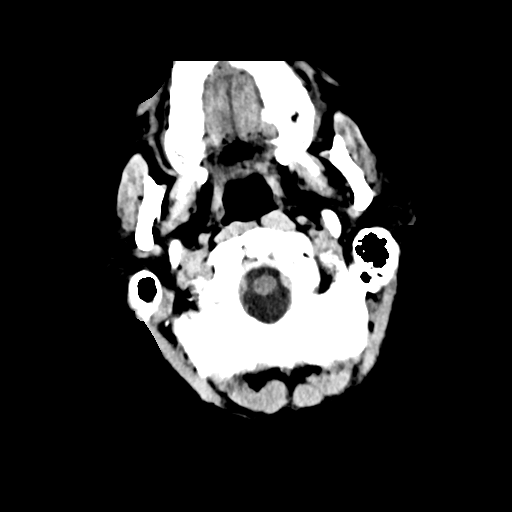
[im 6/41  bone]
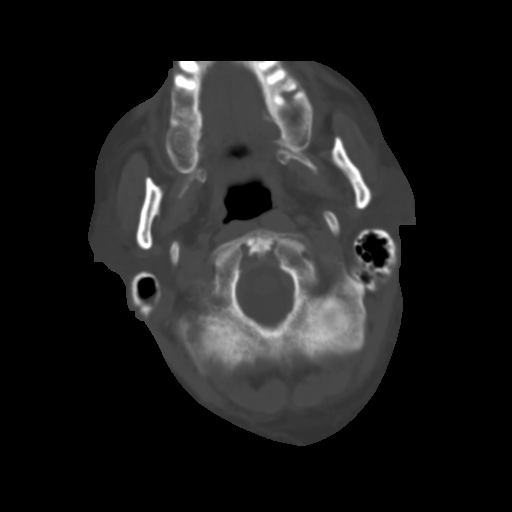
[im 11/41  brain]
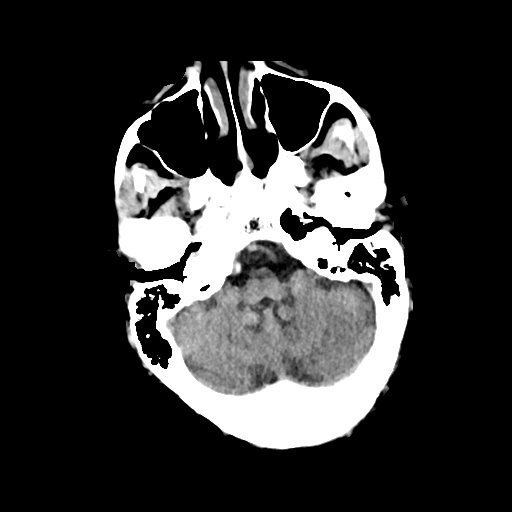
[im 16/41  brain]
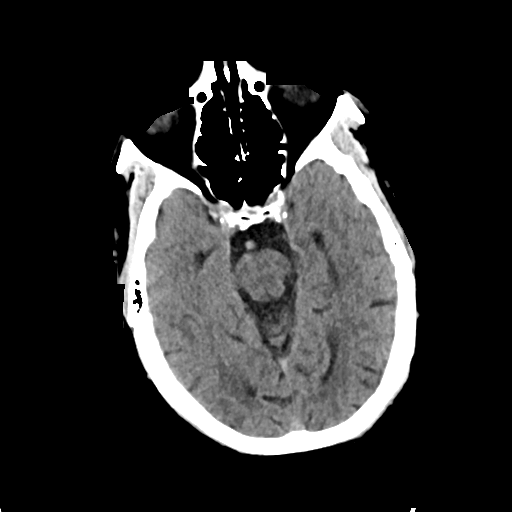
[im 21/41  brain]
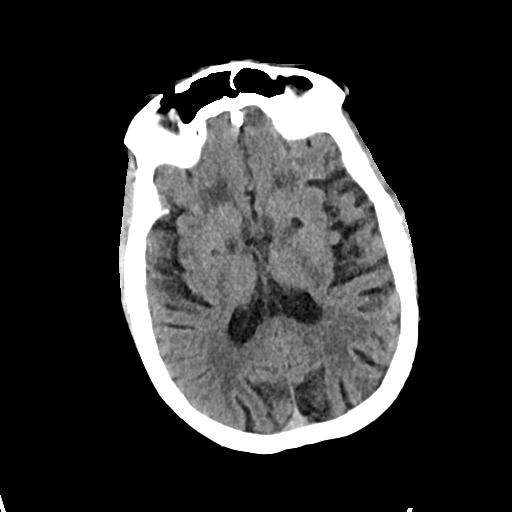
[im 26/41  brain]
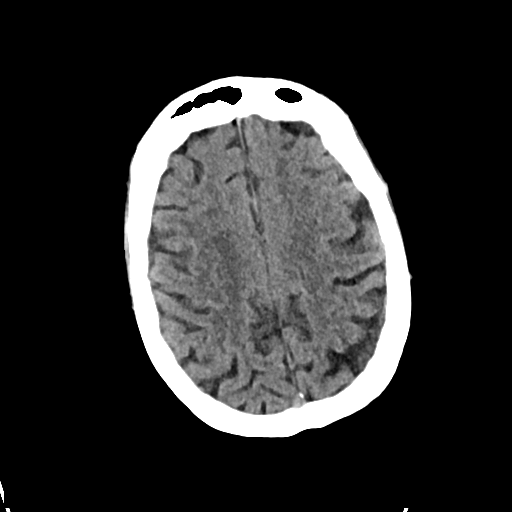
[im 26/41  bone]
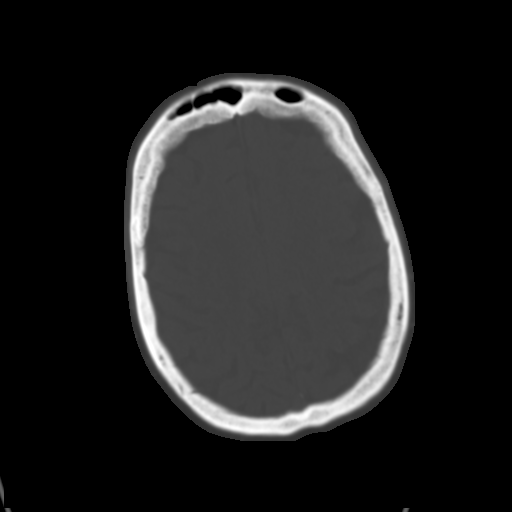
[im 31/41  brain]
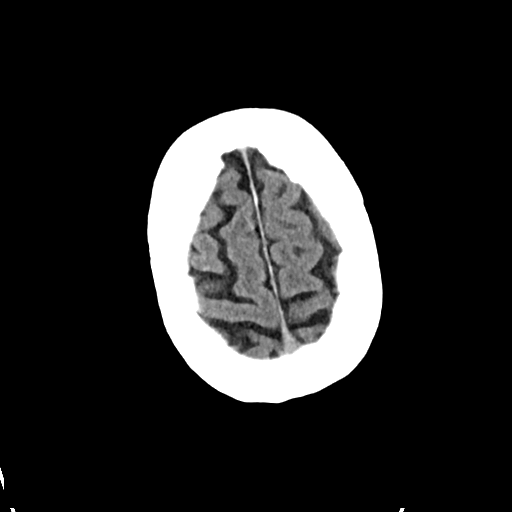
[im 36/41  brain]
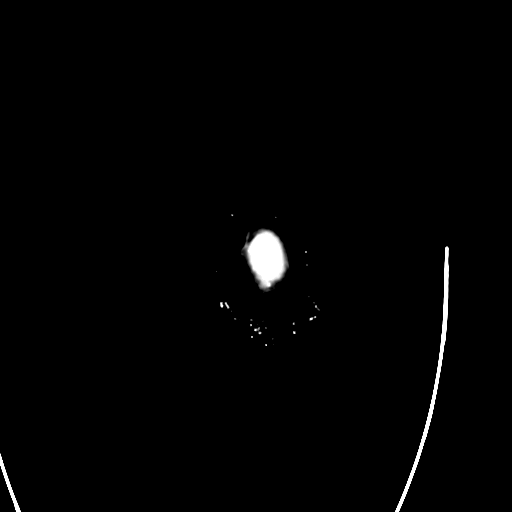

[Series 4: head bone · axial · 0.47mm/px · z∈[-149,-109]mm · 3 of 101 slices shown]
[im 11/101  bone]
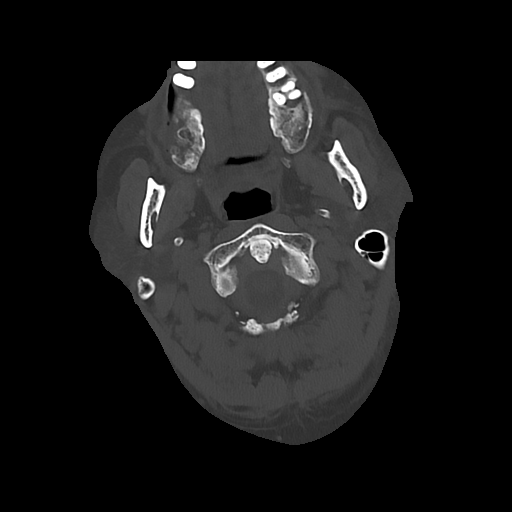
[im 21/101  bone]
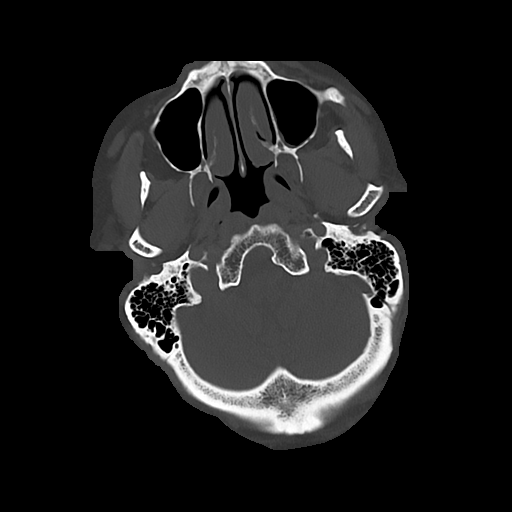
[im 31/101  bone]
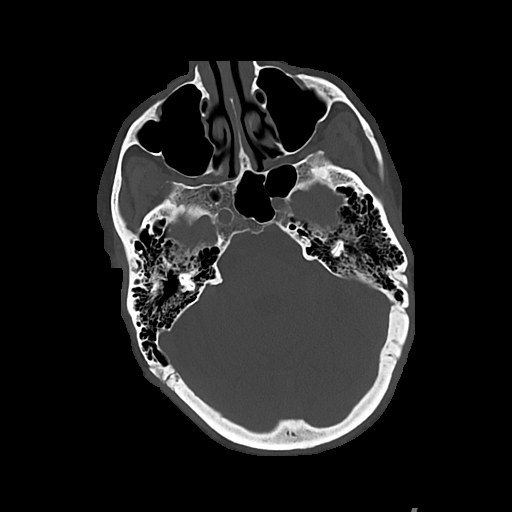

[Series 5: cor soft · coronal · 0.35mm/px · 3 of 98 slices shown]
[im 23/98  brain]
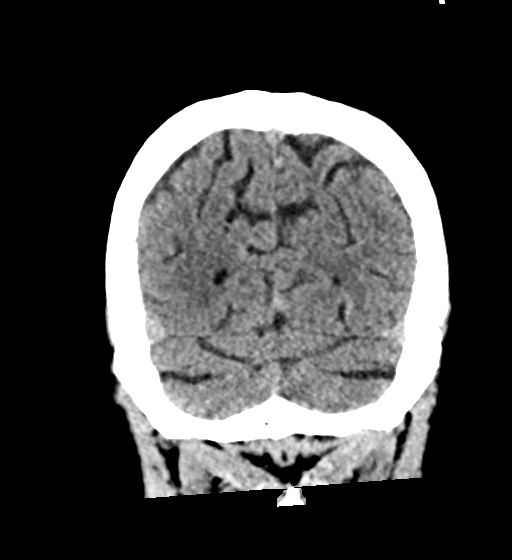
[im 33/98  brain]
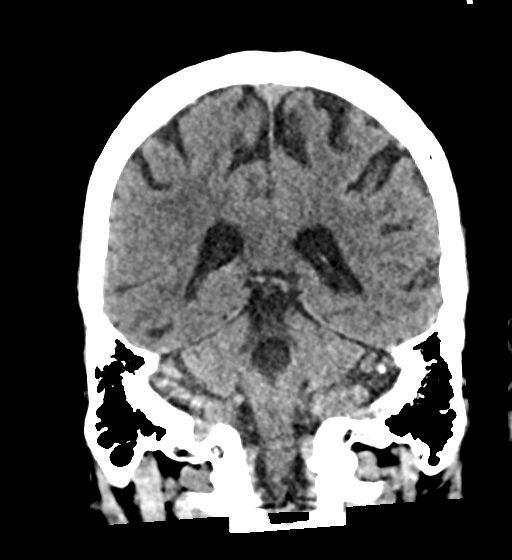
[im 43/98  brain]
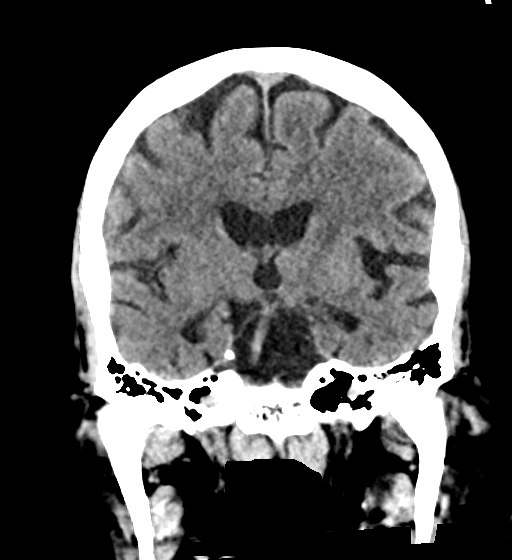

[Series 6: sag soft · sagittal · 0.39mm/px · 3 of 61 slices shown]
[im 21/61  brain]
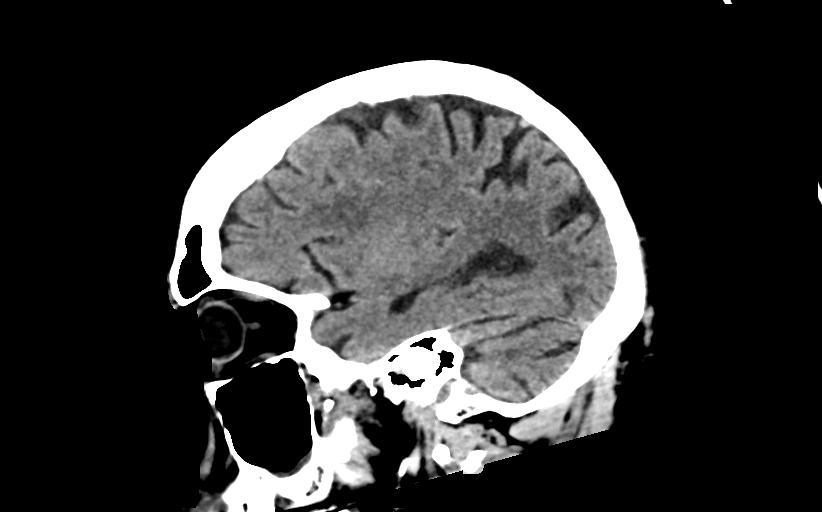
[im 31/61  brain]
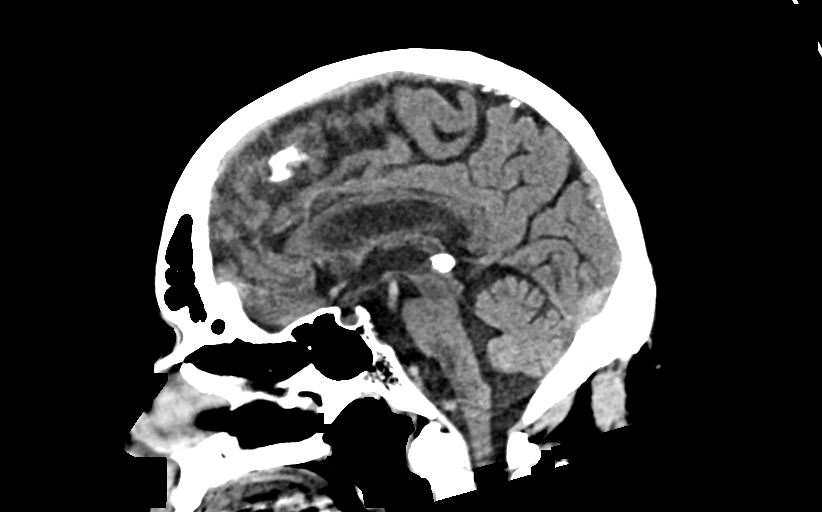
[im 41/61  brain]
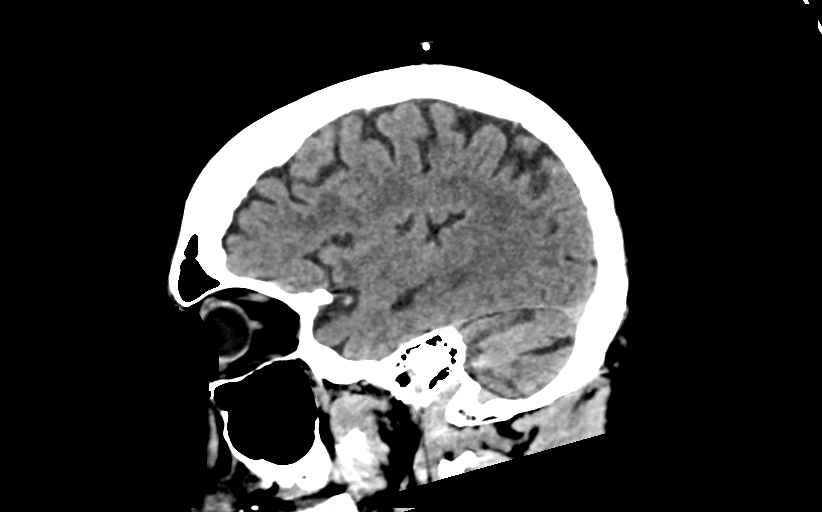

[16 of 47 positions shown; findings below may reference images not displayed]

FINDINGS: Brain: There is mild cerebral atrophy with widening of the
extra-axial spaces and ventricular dilatation.
There are areas of decreased attenuation within the white matter
tracts of the supratentorial brain, consistent with microvascular
disease changes.

Chronic bilateral basal ganglia lacunar infarcts are seen.

Vascular: No hyperdense vessel or unexpected calcification.

Skull: Normal. Negative for fracture or focal lesion.

Sinuses/Orbits: There is mild left maxillary sinus mucosal
thickening.

Other: None.
IMPRESSION: 1. Generalized cerebral atrophy.
2. Chronic bilateral basal ganglia lacunar infarcts.
3. No acute intracranial abnormality.
